# Patient Record
Sex: Female | Born: 1965 | Race: White | Hispanic: Yes | Smoking: Current every day smoker
Health system: Southern US, Community
[De-identification: ages and names within clinical notes are randomized; demographics above are authoritative.]

## PROBLEM LIST (undated history)

## (undated) DIAGNOSIS — I509 Heart failure, unspecified: Secondary | ICD-10-CM

## (undated) DIAGNOSIS — E079 Disorder of thyroid, unspecified: Secondary | ICD-10-CM

## (undated) DIAGNOSIS — E785 Hyperlipidemia, unspecified: Secondary | ICD-10-CM

## (undated) HISTORY — PX: ABDOMINAL HYSTERECTOMY: SHX81

---

## 2014-02-22 ENCOUNTER — Emergency Department (INDEPENDENT_AMBULATORY_CARE_PROVIDER_SITE_OTHER)
Admission: EM | Admit: 2014-02-22 | Discharge: 2014-02-22 | Disposition: A | Discharge status: Home / Self Care | Payer: PRIVATE HEALTH INSURANCE | Source: Home / Self Care | Attending: Family Medicine | Admitting: Family Medicine

## 2014-02-22 DIAGNOSIS — I5043 Acute on chronic combined systolic (congestive) and diastolic (congestive) heart failure: Secondary | ICD-10-CM

## 2014-02-22 NOTE — ED Notes (Signed)
Patient complains of dizziness, states he also has foot pain, states feels "tingly" and also like "ice picks" are going in her feet, symptoms started a week ago, c/o swelling in legs and ankles and feels as if she retaining fluid, states her PCP has advised that if she ever feels as if she's retaining fluid she should be seen

## 2014-02-22 NOTE — ED Provider Notes (Signed)
CSN: [id number]     Arrival date & time 02/22/14  1325 History   First MD Initiated Contact with Patient 02/22/14 1420     Chief Complaint  Patient presents with  . Dizziness      HPI Comments: Patient complains of increased fatigue, dizziness, and nasal congestion over the past several days.  During the past two weeks she has gained about 15 pounds, and has noticed persistent edema upon awakening during the past five days despite her Lasix.  She has had chills/sweats. Last February she was hospitalized with severe CHF, and symptoms suggestive of a viral cardiomyopathy.  She has been advised by her PCP to seek care if she begins to retain fluid.  The history is provided by the patient.    No past medical history on file. No past surgical history on file. No family history on file. History  Substance Use Topics  . Smoking status: Not on file  . Smokeless tobacco: Not on file  . Alcohol Use: Not on file   OB History    No data available     Review of Systems No sore throat + dizziness ? cough No pleuritic pain + wheezing + nasal congestion + post-nasal drainage + sinus pain/pressure No itchy/red eyes No earache No hemoptysis + SOB No fever, + chills/sweats No nausea No vomiting + abdominal pain No diarrhea No urinary symptoms No skin rash + fatigue + myalgias + headache Used OTC meds without relief  Allergies  Review of patient's allergies indicates no known allergies.  Home Medications   Prior to Admission medications   Medication Sig Start Date End Date Taking? Authorizing Provider  clonazePAM (KLONOPIN) 1 MG tablet Take 1 mg by mouth 2 (two) times daily.   Yes Historical Provider, MD  ESTROGENS CONJUGATED PO Take by mouth daily.   Yes Historical Provider, MD  ROPINIROLE HCL PO Take by mouth daily.   Yes Historical Provider, MD  sertraline (ZOLOFT) 100 MG tablet Take 100 mg by mouth 2 (two) times daily.   Yes Historical Provider, MD   BP 112/77 mmHg   Pulse 62  Temp(Src) 97.5 F (36.4 C) (Oral)  Wt 214 lb (97.07 kg)  SpO2 98% Physical Exam Nursing notes and Vital Signs reviewed. Appearance:  Patient appears stated age, and in no acute distress.  Patient is obese Eyes:  Pupils are equal, round, and reactive to light and accomodation.  Extraocular movement is intact.  Conjunctivae are not inflamed  Ears:  Canals normal.  Tympanic membranes normal.  Nose:  Mildly congested turbinates.  No sinus tenderness.   Pharynx:  Minimal erythema Neck:  Supple.  Tender enlarged posterior nodes are palpated bilaterally.  Neck vein distension is present.  Lungs:  Clear to auscultation.  Breath sounds are equal. Chest:  Distinct tenderness to palpation over the mid-sternum.   Heart:  Regular rate and rhythm without murmurs, rubs, or gallops.  Abdomen:  Nontender without masses or hepatosplenomegaly.  Bowel sounds are present.  No CVA or flank tenderness.  Extremities:  Bilateral lower leg pitting edema.  No calf tenderness Skin:  No rash present.   ED Course  Procedures  none      MDM   1. 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

## 2014-03-01 NOTE — Discharge Instructions (Signed)

## 2014-03-07 ENCOUNTER — Emergency Department (INDEPENDENT_AMBULATORY_CARE_PROVIDER_SITE_OTHER)
Admission: EM | Admit: 2014-03-07 | Discharge: 2014-03-07 | Disposition: A | Discharge status: Home / Self Care | Payer: PRIVATE HEALTH INSURANCE | Source: Home / Self Care | Attending: Family Medicine | Admitting: Family Medicine

## 2014-03-07 ENCOUNTER — Encounter: Payer: Self-pay | Admitting: *Deleted

## 2014-03-07 DIAGNOSIS — J069 Acute upper respiratory infection, unspecified: Secondary | ICD-10-CM

## 2014-03-07 DIAGNOSIS — H6503 Acute serous otitis media, bilateral: Secondary | ICD-10-CM

## 2014-03-07 DIAGNOSIS — B9789 Other viral agents as the cause of diseases classified elsewhere: Principal | ICD-10-CM

## 2014-03-07 HISTORY — DX: Hyperlipidemia, unspecified: E78.5

## 2014-03-07 HISTORY — DX: Disorder of thyroid, unspecified: E07.9

## 2014-03-07 HISTORY — DX: Heart failure, unspecified: I50.9

## 2014-03-07 MED ORDER — AZITHROMYCIN 250 MG PO TABS: ORAL_TABLET | ORAL | Status: DC

## 2014-03-07 MED ORDER — TRIAMCINOLONE ACETONIDE 40 MG/ML IJ SUSP
40.0000 mg | Freq: Once | INTRAMUSCULAR | Status: AC
  Administered 2014-03-07: 40 mg via INTRAMUSCULAR

## 2014-03-07 MED ORDER — BENZONATATE 200 MG PO CAPS: 200.0000 mg | ORAL_CAPSULE | Freq: Every day | ORAL | Status: DC

## 2014-03-07 NOTE — ED Notes (Signed)
Pt c/o cough, congestion, runny nose, and SOB x 1 week. Taken Mucinex DM, Robitussin and sinus/cold med OTC.

## 2014-03-07 NOTE — ED Provider Notes (Signed)
CSN: [id number]     Arrival date & time 03/07/14  1311 History   First MD Initiated Contact with Patient 03/07/14 1345     Chief Complaint  Patient presents with  . Cough  . Nasal Congestion      HPI Comments: About five days ago patient developed sore throat, fatigue, sinus congestion, and left ear fullnesss followed by right ear fullness.  Three days ago she developed a cough, wheezing, shortness of breath with activity, and chills/sweats.  The history is provided by the patient.    Past Medical History  Diagnosis Date  . CHF (congestive heart failure)   . Hyperlipidemia   . Thyroid disease    Past Surgical History  Procedure Laterality Date  . Abdominal hysterectomy     Family History  Problem Relation Age of Onset  . Cancer Mother    History  Substance Use Topics  . Smoking status: Former Games developer  . Smokeless tobacco: Never Used  . Alcohol Use: No   OB History    No data available     Review of Systems + sore throat + hoarseness + cough No pleuritic pain + wheezing + nasal congestion + post-nasal drainage No sinus pain/pressure No itchy/red eyes + earache No hemoptysis + SOB No fever, + chills/sweats No nausea No vomiting No abdominal pain No diarrhea No urinary symptoms No skin rash + fatigue No myalgias No headache Used OTC meds without relief  Allergies  Bee venom; Other; and Penicillins  Home Medications   Prior to Admission medications   Medication Sig Start Date End Date Taking? Authorizing Provider  ESTROGENS CONJUGATED PO Take by mouth daily.   Yes Historical Provider, MD  levothyroxine (SYNTHROID, LEVOTHROID) 50 MCG tablet Take 50 mcg by mouth daily before breakfast.   Yes Historical Provider, MD  ROPINIROLE HCL PO Take by mouth daily.   Yes Historical Provider, MD  sertraline (ZOLOFT) 100 MG tablet Take 100 mg by mouth 2 (two) times daily.   Yes Historical Provider, MD  azithromycin (ZITHROMAX Z-PAK) 250 MG tablet Take 2 tabs today;  then begin one tab once daily for 4 more days. 03/07/14   Lattie Haw, MD  benzonatate (TESSALON) 200 MG capsule Take 1 capsule (200 mg total) by mouth at bedtime. Take as needed for cough 03/07/14   Lattie Haw, MD  clonazePAM (KLONOPIN) 1 MG tablet Take 1 mg by mouth 2 (two) times daily.    Historical Provider, MD   BP 108/76 mmHg  Pulse 84  Temp(Src) 97.6 F (36.4 C) (Oral)  Resp 22  Wt 205 lb (92.987 kg)  SpO2 99% Physical Exam Nursing notes and Vital Signs reviewed. Appearance:  Patient appears stated age, and in no acute distress.  Patient is obese Eyes:  Pupils are equal, round, and reactive to light and accomodation.  Extraocular movement is intact.  Conjunctivae are not inflamed  Ears:  Canals normal.  Left tympanic membrane has a blush of erythema with air/fluid level present.  Right tympanic membrane has serous effusion.  Nose:  Congested turbinates.  No sinus tenderness.   Pharynx:  Normal Neck:  Supple.  Tender enlarged posterior nodes are palpated bilaterally  Lungs:  Clear to auscultation.  Breath sounds are equal.  Heart:  Regular rate and rhythm without murmurs, rubs, or gallops.  Abdomen:  Nontender without masses or hepatosplenomegaly.  Bowel sounds are present.  No CVA or flank tenderness.  Extremities:  No edema.  No calf tenderness Skin:  No  rash present.   ED Course  Procedures  none   MDM   1. Viral URI with cough   2. Bilateral acute serous otitis media, recurrence not specified    Kenalog  IM.  Rx for Z-pack to cover atypicals.  Prescription written for Benzonatate [facility]) to take at bedtime for night-time cough.  Take plain Mucinex (1200 mg guaifenesin) twice daily for cough and congestion. Increase fluid intake, rest. May use Afrin nasal spray (or generic oxymetazoline) once daily for about 5 days.  Also recommend using saline nasal spray several times daily and saline nasal irrigation (AYR is a common brand) Try warm salt water gargles for  sore throat.  Continue albuterol inhaler as needed Stop all antihistamines for now, and other non-prescription cough/cold preparations. If symptoms become significantly worse during the night or over the weekend, proceed to the local emergency room.  Followup with Family Doctor in one week.     Lattie Haw, MD 03/09/14 [phone]

## 2014-03-07 NOTE — Discharge Instructions (Signed)
Take plain Mucinex (1200 mg guaifenesin) twice daily for cough and congestion. Increase fluid intake, rest. May use Afrin nasal spray (or generic oxymetazoline) once daily for about 5 days.  Also recommend using saline nasal spray several times daily and saline nasal irrigation (AYR is a common brand) Try warm salt water gargles for sore throat.  Continue albuterol inhaler as needed Stop all antihistamines for now, and other non-prescription cough/cold preparations. If symptoms become significantly worse during the night or over the weekend, proceed to the local emergency room.

## 2014-03-10 ENCOUNTER — Ambulatory Visit: Payer: Self-pay | Admitting: Family Medicine

## 2014-06-03 ENCOUNTER — Telehealth: Payer: Self-pay | Admitting: *Deleted

## 2014-06-19 NOTE — Telephone Encounter (Signed)
error 

## 2014-06-24 ENCOUNTER — Ambulatory Visit: Payer: PRIVATE HEALTH INSURANCE | Admitting: Family Medicine

## 2014-07-11 ENCOUNTER — Ambulatory Visit (INDEPENDENT_AMBULATORY_CARE_PROVIDER_SITE_OTHER): Payer: PRIVATE HEALTH INSURANCE | Admitting: Physical Therapy

## 2014-07-11 DIAGNOSIS — M6281 Muscle weakness (generalized): Secondary | ICD-10-CM

## 2014-07-11 DIAGNOSIS — M7989 Other specified soft tissue disorders: Secondary | ICD-10-CM

## 2014-07-11 DIAGNOSIS — M25562 Pain in left knee: Secondary | ICD-10-CM

## 2014-07-11 DIAGNOSIS — M25662 Stiffness of left knee, not elsewhere classified: Secondary | ICD-10-CM

## 2014-07-11 DIAGNOSIS — R262 Difficulty in walking, not elsewhere classified: Secondary | ICD-10-CM

## 2014-07-11 NOTE — Patient Instructions (Signed)
Chair Knee Flexion   Keeping feet on floor, slide foot of affected leg back, bending knee. You may use other foot to push leg backward. Hold _20-30___ seconds. Repeat _3___ times. Do __3__ sessions a day.  Also actively bend and straighten the knee 10 times. 1-3 sets of 10. 3 x/day.  http://gt2.exer.us/305   Copyright  VHI. All rights reserved.    Quad Set   With other leg bent, foot flat, slowly tighten muscles on thigh of straight leg while counting out loud to _5___.  Repeat _10-20___ times. Do ___3_ sessions per day.  http://gt2.exer.us/276    WEAR KNEE IMMOBILIZER FOR NEXT THREE EXERCISES:  Hip Flexion / Knee Extension: Straight-Leg Raise (Eccentric)   Lie on back. Lift leg with knee straight. Slowly lower leg for 3-5 seconds. __10_ reps per set, 1-3___ sets. 2x per day. Lower like elevator, stopping at each floor.  ABDUCTION: Side-Lying (Active)   Lie on right side, top leg straight. Raise top leg as far as possible.  Complete _1-3__ sets of 10___ repetitions. Perform _2__ sessions per day.  http://gtsc.exer.us/94    Copyright  VHI. All rights reserved.     EXTENSION: Standing (Active)  Stand, both feet flat. Draw right leg behind body as far as possible. Use 0___ lbs. Complete 10 repetitions. 1-3 sets. Perform __2_ sessions per day.  Copyright  VHI. All rights reserved.   Solon PalmJulie Niall Illes, PT 07/11/2014 11:59 AM  [facility] [address] [place], [place] [address]  [phone] (office) [phone] (fax)

## 2014-07-11 NOTE — Therapy (Signed)
[facility] [address] [place], [place], [address] Phone: [phone]   Fax:  [phone]  Physical Therapy Evaluation  Patient Details  Name: Lindsey Love MRN: [id number] Date of Birth: September 30, 1965 Referring Provider:  Francena HanlySupple, Kevin, MD  Encounter Date: 07/11/2014      PT End of Session - 07/11/14 1201    Visit Number 1   Number of Visits 8   Date for PT Re-Evaluation 08/08/14   PT Start Time 1105   PT Stop Time 1203   PT Time Calculation (min) 58 min   Activity Tolerance Patient limited by pain   Behavior During Therapy [facility] for tasks assessed/performed      Past Medical History  Diagnosis Date  . CHF (congestive heart failure)   . Hyperlipidemia   . Thyroid disease     Past Surgical History  Procedure Laterality Date  . Abdominal hysterectomy      There were no vitals filed for this visit.  Visit Diagnosis:  Pain in left knee - Plan: PT plan of care cert/re-cert  Stiffness of knee joint, left - Plan: PT plan of care cert/re-cert  Swelling of limb - Plan: PT plan of care cert/re-cert  Generalized muscle weakness - Plan: PT plan of care cert/re-cert  Difficulty walking - Plan: PT plan of care cert/re-cert      Subjective Assessment - 07/11/14 1110    Subjective Patient reports she fell this morning coming up a curb while letting her dog out. she fell on her left side and now c/o left sided arm, hip and leg pain as well as increased swelling in foot. Patient Robynn Paneorigianlly tore her ACL 06/25/14 when she fell off a ladder while putting up wallpaper.  She has been in a knee immobilizer for two weeks. and amb with one crutch.   Limitations Sitting   How long can you sit comfortably? 5 min   How long can you stand comfortably? 30 minutes   Diagnostic tests MRI   Currently in Pain? Yes   Pain Score 8    Pain Location Knee   Pain Orientation Left   Pain Descriptors / Indicators Burning;Sharp   Pain Type  Acute pain   Pain Onset 1 to 4 weeks ago   Pain Frequency Constant   Aggravating Factors  prolonged sitting standing walking   Pain Relieving Factors meds (aleve)   Effect of Pain on Daily Activities patient cannot wok as hairdresser, difficulty climbing stairs (40) at apartment complex            [facility] PT Assessment - 07/11/14 0001    Assessment   Medical Diagnosis Lt knee ACL tear   Onset Date 06/25/14   Next MD Visit 08/06/14   Prior Therapy none   Precautions   Precautions Knee   Required Braces or Orthoses Knee Immobilizer - Left   Restrictions   Weight Bearing Restrictions Yes   LLE Weight Bearing Weight bearing as tolerated   Balance Screen   Has the patient fallen in the past 6 months Yes   How many times? 2   Has the patient had a decrease in activity level because of a fear of falling?  Yes   Is the patient reluctant to leave their home because of a fear of falling?  Yes   Prior Function   Level of Independence Independent with basic ADLs   Vocation Full time employment   Vocation Requirements standing 1hour   Observation/Other Assessments   Focus  on Therapeutic Outcomes (FOTO)  82% limited (goal 50%)   ROM / Strength   AROM / PROM / Strength AROM;Strength   AROM   Overall AROM Comments bil hips WNL   AROM Assessment Site Knee   Right/Left Knee Left   Left Knee Extension 0   Left Knee Flexion 10  Passive 90   Strength   Overall Strength Comments Left knee ext 3+/5, flex 4-/5  Rt hip flex; knee 5/5   Strength Assessment Site Hip   Right/Left Hip Left   Left Hip Flexion 4/5   Left Hip Extension --  unable to test   Left Hip ABduction 5/5   Flexibility   Soft Tissue Assessment /Muscle Length yes   Hamstrings L WNL   Palpation   Palpation Marked TTP of med and lat left knee, mod tenderness of left patellar tendon  edema ankle L 29 cm R cm 25.5 cm   Ambulation/Gait   Ambulation/Gait Yes   Ambulation/Gait Assistance 6: Modified independent  (Device/Increase time)   Ambulation Distance (Feet) 30 Feet   Assistive device Crutches;Other (Comment)  single crutch    Gait Pattern Decreased step length - left;Decreased stance time - left;Left circumduction   Ambulation Surface Level   Gait Comments Patient using crutch on affected side  educated to use on R side.                   [facility] Adult PT Treatment/Exercise - 07/11/14 0001    Exercises   Exercises Knee/Hip   Knee/Hip Exercises: Stretches   Quad Stretch 3 reps;20 seconds  seated using RLE to hold stretch   Knee/Hip Exercises: Standing   Other Standing Knee Exercises hip ext at counter with immobilizer on. PT demo'd.   Knee/Hip Exercises: Seated   Heel Slides AROM;Left;5 reps   Heel Slides Limitations requires use of RLE to reach end range   Knee/Hip Exercises: Supine   Quad Sets 5 reps  5 sec hold   Quad Sets Limitations poor quad contraction. advised patient to sit up palpate and look at quad as well as perform bil  for feedback   Straight Leg Raises Strengthening  2 reps with PT assist; Pt to do in immobilizer   Knee/Hip Exercises: Sidelying   Hip ABduction --  to do in immobilizer; performed with PT support in eval   Modalities   Modalities Cryotherapy;Electrical Stimulation   Cryotherapy   Number Minutes Cryotherapy 15 Minutes   Cryotherapy Location Knee   Type of Cryotherapy --  vaso 3* med pressure   Electrical Stimulation   Electrical Stimulation Location ankle/knee   Electrical Stimulation Action ion repelling   Electrical Stimulation Parameters to tolerance   Electrical Stimulation Goals Edema                PT Education - 07/11/14 1200    Education provided Yes   Education Details HEP; educated on use of single crutch in RUE.   Person(s) Educated Patient   Methods Explanation;Demonstration;Handout   Comprehension Verbalized understanding;Returned demonstration          PT Short Term Goals - 07/11/14 1232    PT SHORT  TERM GOAL #1   Title I with initial HEP   Time 2   Period Weeks   Status New   PT SHORT TERM GOAL #2   Title able to perform a solid quad contraction and SLR on L with no lag   Time 2   Period Weeks   Status  New   PT SHORT TERM GOAL #3   Title Improved Left knee flexion to 100 degrees actively   Time 2   Period Weeks   Status New           PT Long Term Goals - 07/11/14 1233    PT LONG TERM GOAL #1   Title I with advanced HEP   Time 4   Period Weeks   Status New   PT LONG TERM GOAL #2   Title L knee flexion within 10 degrees of Rt   Time 4   Period Weeks   Status New   PT LONG TERM GOAL #3   Title demo 5/5 LLE strength   Time 4   Period Weeks   PT LONG TERM GOAL #4   Title climb stairs with a reciprocal gait pattern on stairs without immobilizer   Time 4   Period Weeks   Status New   PT LONG TERM GOAL #5   Title amb with a normal gait pattern without AD    Time 4   Status New   PT LONG TERM GOAL #6   Title decreased pain by 50% with sitting and standing   Time 4   Period Weeks   Status New   PT LONG TERM GOAL #7   Title improved FOTO to 50%   Time 4   Period Weeks   Status New               Plan - 07/11/14 1203    Clinical Impression Statement Patient is a pleasant 49 year old female who fell off a ladder 06/25/14 and tore her left ACL. She has been in an immobllizer for two weeks and presents to therapy to restore strength and ROM before surgery. She fell on her left side this morning going up a curb and has increased pain and swelling in the LLE. No bruising is present. Patient ambulates WBAT with a single crutch, however she reports that she brings the crutch mainly for safety.  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 She is presently unable to do this. She demos poor quad contraction and has decreased ROM affecting ADLS.  She lives in an apartment with 40 stairs and must use a step to gait pattern with immobilizer.    Pt will benefit from skilled therapeutic intervention in order to improve on the following deficits Abnormal gait;Decreased range of motion;Difficulty walking;Increased edema;Decreased activity tolerance;Pain;Decreased strength;Decreased mobility   Rehab Potential Good   PT Frequency 2x / week   PT Duration 4 weeks   PT Treatment/Interventions ADLs/Self Care Home Management;Moist Heat;Patient/family education;DME Instruction;Therapeutic exercise;Passive range of motion;Ultrasound;Gait training;Manual techniques;Cryotherapy;Stair training;Neuromuscular re-education;Electrical Stimulation   PT Next Visit Plan ROM, quad/hip strength, modalities for pain/edema   Consulted and Agree with Plan of Care Patient         Problem List There are no active problems to display for this patient.   Solon Palm PT  07/11/2014, 12:49 PM  [facility] [address] [place], [place], [address] Phone: [phone]   Fax:  [phone]

## 2014-07-14 ENCOUNTER — Encounter: Payer: PRIVATE HEALTH INSURANCE | Admitting: Physical Therapy

## 2014-07-16 ENCOUNTER — Ambulatory Visit (INDEPENDENT_AMBULATORY_CARE_PROVIDER_SITE_OTHER): Payer: PRIVATE HEALTH INSURANCE | Admitting: Physical Therapy

## 2014-07-16 DIAGNOSIS — M25662 Stiffness of left knee, not elsewhere classified: Secondary | ICD-10-CM

## 2014-07-16 DIAGNOSIS — M7989 Other specified soft tissue disorders: Secondary | ICD-10-CM | POA: Diagnosis not present

## 2014-07-16 DIAGNOSIS — M6281 Muscle weakness (generalized): Secondary | ICD-10-CM

## 2014-07-16 DIAGNOSIS — M25562 Pain in left knee: Secondary | ICD-10-CM

## 2014-07-16 DIAGNOSIS — R262 Difficulty in walking, not elsewhere classified: Secondary | ICD-10-CM

## 2014-07-16 NOTE — Therapy (Signed)
[place], [place], [address] Phone: [phone]   Fax:  [phone]  Physical Therapy Treatment  Patient Details  Name: Lindsey Love MRN: [id number] Date of Birth: 1965-09-28 Referring Provider:  Justice Britain, MD  Encounter Date: 07/16/2014      PT End of Session - 07/16/14 1111    Visit Number 2   Number of Visits 8   Date for PT Re-Evaluation 08/08/14   PT Start Time 1107   PT Stop Time 1205   PT Time Calculation (min) 58 min   Activity Tolerance Patient limited by pain      Past Medical History  Diagnosis Date  . CHF (congestive heart failure)   . Hyperlipidemia   . Thyroid disease     Past Surgical History  Procedure Laterality Date  . Abdominal hysterectomy      There were no vitals filed for this visit.  Visit Diagnosis:  Pain in left knee  Stiffness of knee joint, left  Swelling of limb  Generalized muscle weakness  Difficulty walking      Subjective Assessment - 07/16/14 1111    Subjective Pt presents with Lt knee immobilizer (no crutches).  Pt reports she is compliant with HEP (2-3x/day). Pt voiced being anxious to have surgery to move things along.    Currently in Pain? Yes   Pain Score 9    Pain Location Knee   Pain Orientation Left   Pain Descriptors / Indicators Burning;Sharp   Aggravating Factors  prolonged positions    Pain Relieving Factors ice, medicine             [facility] PT Assessment - 07/16/14 0001    Assessment   Medical Diagnosis Lt knee ACL tear   Onset Date 06/25/14   Next MD Visit 08/06/14   AROM   AROM Assessment Site Knee;Ankle   Right/Left Knee Left   Left Knee Extension 0   Left Knee Flexion 85  heel slide, AAROM to 105   Right/Left Ankle Left   Left Ankle Dorsiflexion 56   Left Ankle Plantar Flexion 2                     [facility] Adult PT Treatment/Exercise - 07/16/14 0001    Knee/Hip Exercises: Seated   Long Arc  Quad AROM;Left;10 reps   Knee/Hip Exercises: Supine   Quad Sets 5 reps   Heel Slides AROM;AAROM;5 reps  3 reps AROM to 85deg, 2 reps with strap to 105 deg   Heel Slides Limitations very slow speed due to pain    Straight Leg Raises Left;1 set  8 reps    Other Supine Knee Exercises Hip ABD <> add with foot on towel x 10 reps each direction    Knee/Hip Exercises: Sidelying   Clams Lt x 10 reps    Knee/Hip Exercises: Prone   Other Prone Exercises TKE 5 sec hold x 8 reps (initially difficult to DF)    Modalities   Modalities Cryotherapy;Electrical Stimulation   Cryotherapy   Number Minutes Cryotherapy 15 Minutes   Cryotherapy Location Knee;Ankle   Type of Cryotherapy --  vaso, med, 3* to knee, icepack to ankle   Acupuncturist Stimulation Location Lt knee    Electrical Stimulation Action ion repelling    Electrical Stimulation Parameters to tolerance x 15 min    Electrical Stimulation Goals Edema;Pain   Manual Therapy   Manual Therapy Myofascial release  Myofascial Release to Lt glute/piriformis (decreased tolerance )                   PT Short Term Goals - 07/16/14 1359    PT SHORT TERM GOAL #1   Title I with initial HEP   Time 2   Period Weeks   Status Achieved   PT SHORT TERM GOAL #2   Title able to perform a solid quad contraction and SLR on L with no lag   Time 2   Period Weeks   Status Achieved   PT SHORT TERM GOAL #3   Title Improved Left knee flexion to 100 degrees actively   Time 2   Period Weeks   Status On-going           PT Long Term Goals - 07/11/14 1233    PT LONG TERM GOAL #1   Title I with advanced HEP   Time 4   Period Weeks   Status New   PT LONG TERM GOAL #2   Title L knee flexion within 10 degrees of Rt   Time 4   Period Weeks   Status New   PT LONG TERM GOAL #3   Title demo 5/5 LLE strength   Time 4   Period Weeks   PT LONG TERM GOAL #4   Title climb stairs with a reciprocal gait pattern on stairs  without immobilizer   Time 4   Period Weeks   Status New   PT LONG TERM GOAL #5   Title amb with a normal gait pattern without AD    Time 4   Status New   PT LONG TERM GOAL #6   Title decreased pain by 50% with sitting and standing   Time 4   Period Weeks   Status New   PT LONG TERM GOAL #7   Title improved FOTO to 50%   Time 4   Period Weeks   Status New               Plan - 07/16/14 1357    Clinical Impression Statement Pt demo improved Lt knee ROM and quad strength this visit compared to last session.  Pain continues to be a barrier.  Pt has met STG #1 and 2.    Pt will benefit from skilled therapeutic intervention in order to improve on the following deficits Abnormal gait;Decreased range of motion;Difficulty walking;Increased edema;Decreased activity tolerance;Pain;Decreased strength;Decreased mobility   Rehab Potential Good   PT Frequency 2x / week   PT Duration 4 weeks   PT Treatment/Interventions ADLs/Self Care Home Management;Moist Heat;Patient/family education;DME Instruction;Therapeutic exercise;Passive range of motion;Ultrasound;Gait training;Manual techniques;Cryotherapy;Stair training;Neuromuscular re-education;Electrical Stimulation   PT Next Visit Plan ROM, quad/hip strength, modalities for pain/edema   Consulted and Agree with Plan of Care Patient        Problem List There are no active problems to display for this patient.  Kerin Perna, PTA 07/16/2014 2:01 PM  [place], [place], [address] Phone: [phone]   Fax:  [phone]

## 2014-07-16 NOTE — Patient Instructions (Signed)
SEATED Gastroc / Heel Cord Stretch - Seated With Towel   Sit on floor, towel around ball of foot. Gently pull foot in toward body, stretching heel cord and calf. Hold for _30__ seconds. Repeat on involved leg. Repeat __3_ times. Do _3__ times per day.   [facility] [address] [place], [place] [address]  [phone] (office) [phone] (fax)

## 2014-07-18 ENCOUNTER — Ambulatory Visit (INDEPENDENT_AMBULATORY_CARE_PROVIDER_SITE_OTHER): Payer: PRIVATE HEALTH INSURANCE | Admitting: Physical Therapy

## 2014-07-18 DIAGNOSIS — M7989 Other specified soft tissue disorders: Secondary | ICD-10-CM | POA: Diagnosis not present

## 2014-07-18 DIAGNOSIS — M25662 Stiffness of left knee, not elsewhere classified: Secondary | ICD-10-CM

## 2014-07-18 DIAGNOSIS — M6281 Muscle weakness (generalized): Secondary | ICD-10-CM | POA: Diagnosis not present

## 2014-07-18 DIAGNOSIS — M25562 Pain in left knee: Secondary | ICD-10-CM

## 2014-07-18 DIAGNOSIS — R262 Difficulty in walking, not elsewhere classified: Secondary | ICD-10-CM

## 2014-07-18 NOTE — Therapy (Addendum)
[place], [place], [address] Phone: [phone]   Fax:  [phone]  Physical Therapy Treatment  Patient Details  Name: Lindsey Love MRN: [id number] Date of Birth: 12-08-1965 Referring Provider:  Justice Britain, MD  Encounter Date: 07/18/2014      PT End of Session - 07/18/14 1115    Visit Number 3   Number of Visits 8   Date for PT Re-Evaluation 08/08/14   PT Start Time 1111   PT Stop Time 1204   PT Time Calculation (min) 53 min   Activity Tolerance Patient limited by pain      Past Medical History  Diagnosis Date  . CHF (congestive heart failure)   . Hyperlipidemia   . Thyroid disease     Past Surgical History  Procedure Laterality Date  . Abdominal hysterectomy      There were no vitals filed for this visit.  Visit Diagnosis:  Pain in left knee  Stiffness of knee joint, left  Swelling of limb  Generalized muscle weakness  Difficulty walking      Subjective Assessment - 07/18/14 1116    Subjective Pt reports she has been working on her Lt knee ROM and her HEP, walking short household distances without immobilizer.    Currently in Pain? Yes   Pain Score 7    Pain Location Knee   Pain Orientation Left   Pain Descriptors / Indicators Burning;Sharp   Aggravating Factors  extension without the brace, prolonged positions    Pain Relieving Factors ice, medicine    Multiple Pain Sites Yes   Pain Score 5   Pain Location Knee   Pain Orientation Right   Pain Descriptors / Indicators Aching            [facility] PT Assessment - 07/18/14 0001    Assessment   Medical Diagnosis Lt knee ACL tear   Onset Date 06/25/14   AROM   AROM Assessment Site Knee   Right/Left Knee Left   Left Knee Extension 0   Left Knee Flexion 115   Strength   Strength Assessment Site Hip   Right/Left Hip Left   Left Hip Flexion 4+/5   Left Hip ABduction 5/5                      [facility] Adult PT Treatment/Exercise - 07/18/14 0001    Exercises   Exercises Knee/Hip   Knee/Hip Exercises: Stretches   Gastroc Stretch 2 reps;30 seconds   Knee/Hip Exercises: Aerobic   Stationary Bike NuStep for ROM to LLE (use of UE) x 5 min.    Knee/Hip Exercises: Standing   Heel Raises 10 reps;1 set  with toe raises and heavy UE support    Terminal Knee Extension Left;1 set;10 reps;Theraband   Theraband Level (Terminal Knee Extension) Level 2 (Red)   SLS 3 trials with LLE up to  5 sec.    Other Standing Knee Exercises Weight shifts R to L (without immobilizer)   Knee/Hip Exercises: Seated   Other Seated Knee Exercises sit to stand x 5 reps (heavy use of UE and slow speed)    Knee/Hip Exercises: Supine   Straight Leg Raises Left;10 reps   Modalities   Modalities Cryotherapy;Vasopneumatic;Electrical Stimulation   Cryotherapy   Number Minutes Cryotherapy 15 Minutes   Cryotherapy Location Knee  Rt knee   Type of Cryotherapy Ice pack   Electrical Stimulation   Electrical Stimulation Location Lt knee  Astronomer Parameters to tolerance x 15 min    Electrical Stimulation Goals Edema;Pain   Vasopneumatic   Number Minutes Vasopneumatic  15 minutes   Vasopnuematic Location  Knee  Lt   Vasopneumatic Pressure Medium   Vasopneumatic Temperature  3*                  PT Short Term Goals - 07/18/14 1202    PT SHORT TERM GOAL #1   Title I with initial HEP   Time 2   Period Weeks   Status Achieved   PT SHORT TERM GOAL #2   Title able to perform a solid quad contraction and SLR on L with no lag   Time 2   Period Weeks   Status Achieved   PT SHORT TERM GOAL #3   Title Improved Left knee flexion to 100 degrees actively   Time 2   Period Weeks   Status Achieved           PT Long Term Goals - 07/11/14 1233    PT LONG TERM GOAL #1   Title I with advanced HEP   Time 4   Period Weeks   Status New   PT  LONG TERM GOAL #2   Title L knee flexion within 10 degrees of Rt   Time 4   Period Weeks   Status New   PT LONG TERM GOAL #3   Title demo 5/5 LLE strength   Time 4   Period Weeks   PT LONG TERM GOAL #4   Title climb stairs with a reciprocal gait pattern on stairs without immobilizer   Time 4   Period Weeks   Status New   PT LONG TERM GOAL #5   Title amb with a normal gait pattern without AD    Time 4   Status New   PT LONG TERM GOAL #6   Title decreased pain by 50% with sitting and standing   Time 4   Period Weeks   Status New   PT LONG TERM GOAL #7   Title improved FOTO to 50%   Time 4   Period Weeks   Status New               Plan - 07/18/14 1200    Clinical Impression Statement Pt demo improved strength and Lt knee ROM this visit.  Pt has met STG #3 and is making good progress towards other goals.    Pt will benefit from skilled therapeutic intervention in order to improve on the following deficits Abnormal gait;Decreased range of motion;Difficulty walking;Increased edema;Decreased activity tolerance;Pain;Decreased strength;Decreased mobility   Rehab Potential Good   PT Frequency 2x / week   PT Duration 4 weeks   PT Treatment/Interventions ADLs/Self Care Home Management;Moist Heat;Patient/family education;DME Instruction;Therapeutic exercise;Passive range of motion;Ultrasound;Gait training;Manual techniques;Cryotherapy;Stair training;Neuromuscular re-education;Electrical Stimulation   PT Next Visit Plan ROM, quad/hip strength, modalities for pain/edema   Consulted and Agree with Plan of Care Patient        Problem List There are no active problems to display for this patient.   Kerin Perna, PTA 07/18/2014 12:21 PM  [place], [place], [address] Phone: [phone]   Fax:  [phone]     PHYSICAL THERAPY DISCHARGE SUMMARY  Visits from Start of Care:  3  Current functional level related to goals / functional outcomes: Pt to have surgery  Remaining deficits: See above   Education / Equipment: HEP Plan: Patient agrees to discharge.  Patient goals were not met. Patient is being discharged due to a change in medical status. Patient to have surgery ?????   Jeral Pinch, PT 07/31/2014 10:24 AM

## 2014-07-21 ENCOUNTER — Encounter: Payer: PRIVATE HEALTH INSURANCE | Admitting: Physical Therapy

## 2014-07-23 ENCOUNTER — Encounter: Payer: PRIVATE HEALTH INSURANCE | Admitting: Physical Therapy

## 2014-07-28 ENCOUNTER — Encounter: Payer: PRIVATE HEALTH INSURANCE | Admitting: Physical Therapy

## 2014-07-30 ENCOUNTER — Encounter: Payer: PRIVATE HEALTH INSURANCE | Admitting: Physical Therapy

## 2014-08-06 ENCOUNTER — Ambulatory Visit: Payer: PRIVATE HEALTH INSURANCE | Admitting: Physical Therapy

## 2014-08-07 ENCOUNTER — Ambulatory Visit (INDEPENDENT_AMBULATORY_CARE_PROVIDER_SITE_OTHER): Payer: PRIVATE HEALTH INSURANCE | Admitting: Rehabilitative and Restorative Service Providers"

## 2014-08-07 DIAGNOSIS — M6281 Muscle weakness (generalized): Secondary | ICD-10-CM | POA: Diagnosis not present

## 2014-08-07 DIAGNOSIS — M7989 Other specified soft tissue disorders: Secondary | ICD-10-CM

## 2014-08-07 DIAGNOSIS — M25662 Stiffness of left knee, not elsewhere classified: Secondary | ICD-10-CM | POA: Diagnosis not present

## 2014-08-07 DIAGNOSIS — R262 Difficulty in walking, not elsewhere classified: Secondary | ICD-10-CM

## 2014-08-07 DIAGNOSIS — M25562 Pain in left knee: Secondary | ICD-10-CM

## 2014-08-07 NOTE — Patient Instructions (Signed)
Quad Set    [place] [phone]   Slowly tighten muscles on thigh of straight leg while counting out loud to _10___. Repeat with other leg. Repeat __10__ times. Do _2-3___ sessions per day.  Strengthening: Straight Leg Raise (Phase 1)   Tighten muscles on front of right thigh, then lift leg _10-12___ inches from surface, keeping knee locked.  Repeat __10__ times per set. Do _1-3___ sets per session. Do _2-3___ sessions per day.  Hip Extension (Prone)   Lift left leg _8-10___ inches from floor, keeping knee locked. Repeat _10___ times per set. Do _1-3__ sets per session. Do __2-3__ sessions per day.  Hip Adduction: Leg Lift (Eccentric) - Side-Lying   Lie on side with top leg bent, foot flat behind lower leg.  lift lower leg. Hold for 3-5 seconds. _10__ reps per set, 1-3___ sets per day, 2-3___ days per week.   Abduction: Side Leg Lift (Eccentric) - Side-Lying   Lie on side. Lift top leg slightly higher than shoulder level. Keep top leg straight with body, toes pointing forward. Slowly lower for 3-5 seconds. _10__ reps per set, 1-3___ sets per day, _2-3__ days per week.      Quad Strength: Retro Stool Walk   Using both legs, "walk" backward down a long hall. Repeat _10___ times or for ____ minutes. Do 2-3____ sessions per day.   .On back, prop ankle on towel roll, allowing knee to straighten out without calf touching the surface. Allow knee to stretch for 1 min relax and repeat 2-3 times 2-3 times per day.

## 2014-08-07 NOTE — Therapy (Signed)
[facility] [place] [address] [place], [place], [address] Phone: [phone]   Fax:  [phone]  Physical Therapy Evaluation  Patient Details  Name: Crissie Aloi MRN: [id number] Date of Birth: Aug 02, 1965 Referring Provider:  Francena Hanly, MD  Encounter Date: 08/07/2014      PT End of Session - 08/07/14 1703    Visit Number 1   Number of Visits 24   Date for PT Re-Evaluation 10/30/14   PT Start Time 1600   Activity Tolerance Patient tolerated treatment well      Past Medical History  Diagnosis Date  . CHF (congestive heart failure)   . Hyperlipidemia   . Thyroid disease     Past Surgical History  Procedure Laterality Date  . Abdominal hysterectomy      There were no vitals filed for this visit.  Visit Diagnosis:  Pain in left knee - Plan: PT plan of care cert/re-cert  Stiffness of knee joint, left - Plan: PT plan of care cert/re-cert  Swelling of limb - Plan: PT plan of care cert/re-cert  Generalized muscle weakness - Plan: PT plan of care cert/re-cert  Difficulty walking - Plan: PT plan of care cert/re-cert      Subjective Assessment - 08/07/14 1530    Subjective Injury to Lt knee when she fell from a ladder sustaining ACL tear 06/25/14, seen for PT to increase ROM and strength Lt LE prior to surgery; underwent ACL repair 07/29/14. Doing OK since surgery.   Diagnostic tests MRI showed ACL tear    Currently in Pain? Yes   Pain Score 6    Pain Location Knee   Pain Orientation Left   Pain Descriptors / Indicators Burning;Sore   Pain Type Acute pain   Pain Onset More than a month ago   Pain Frequency Constant   Aggravating Factors  Laying with knee unsupported, tired with activity which also causes swelling.   Pain Relieving Factors Ice, medicine   Effect of Pain on Daily Activities patient unable to work due to injury and surgery, difficulty standing and walking, Has 40 stairs to enter/leave her  apartment.           [facility] PT Assessment - 08/07/14 0001    Assessment   Medical Diagnosis Lt knee ACL tear   Onset Date/Surgical Date 07/29/14   Next MD Visit 08/20/14   Precautions   Precautions Knee   Required Braces or Orthoses --  knee immobilizer - left   Restrictions   LLE Weight Bearing Weight bearing as tolerated   Balance Screen   Has the patient fallen in the past 6 months Yes   How many times? 2   Has the patient had a decrease in activity level because of a fear of falling?  Yes   Is the patient reluctant to leave their home because of a fear of falling?  Yes   Home Environment   Living Environment Private residence   Home Access Stairs to enter  40   Entrance Stairs-Number of Steps 40   Entrance Stairs-Rails Can reach both   Home Layout One level   Home Equipment Walker - 2 wheels;Walker - 4 wheels;Crutches;Bedside commode;Shower seat;Grab bars - toilet;Grab bars - tub/shower;Hand held shower head   Prior Function   Level of Independence Independent   Vocation Full time employment  hair dresser   Vocation Requirements standing 6 hr/day average   Observation/Other Assessments   Focus on Therapeutic Outcomes (FOTO)  82% limitation  ROM / Strength   AROM / PROM / Strength AROM   AROM   AROM Assessment Site Knee   Right/Left Knee Left   Left Knee Extension --  (-)4   Left Ankle Dorsiflexion 0   Left Ankle Plantar Flexion 53   Strength   Strength Assessment Site Hip   Right/Left Hip Left   Left Hip Flexion 4-/5   Left Hip ABduction 4/5   Left Hip ADduction 4+/5          [facility] Adult PT Treatment/Exercise - 08/07/14 0001    Ambulation/Gait   Ambulation/Gait Yes   Ambulation/Gait Assistance 6: Modified independent (Device/Increase time)   Ambulation Distance (Feet) 30 Feet   Assistive device Crutches   Gait Pattern Decreased step length - left;Decreased stance time - left;Left circumduction   Gait velocity slowed   Knee/Hip Exercises: Seated    Other Seated Knee Exercises sitting knee flexion 1 min x2 reps   Knee/Hip Exercises: Supine   Quad Sets Strengthening;Left;2 sets;5 reps   Quad Sets Limitations lateral quad much more active than middle and medial quad   Heel Slides AAROM;Left;5 sets;5 reps  patient assisted with sheet around foot   Straight Leg Raises Strengthening;Left;2 sets;5 sets  assisted by PT   Other Supine Knee Exercises Hip a   Knee/Hip Exercises: Sidelying   Hip ABduction Strengthening;Left;2 sets;5 reps   Hip ADduction Strengthening;2 sets;5 reps   Knee/Hip Exercises: Prone   Hip Extension Strengthening;2 sets;Left;5 reps   Modalities   Modalities Cryotherapy   Cryotherapy   Number Minutes Cryotherapy 15 Minutes   Cryotherapy Location Knee   Type of Cryotherapy Other (comment)   Electrical Stimulation   Electrical Stimulation Location Lt knee    Electrical Stimulation Action IFC   Electrical Stimulation Parameters to tolerance   Electrical Stimulation Goals Edema;Pain   Vasopneumatic   Number Minutes Vasopneumatic  15 minutes   Vasopnuematic Location  Knee   Vasopneumatic Pressure Low   Vasopneumatic Temperature  3*                PT Education - 08/07/14 1635    Education provided Yes   Education Details HEP; importance of consistent exercises; nature of rehab; self massage to quad   Person(s) Educated Patient   Methods Explanation;Demonstration;Tactile cues;Verbal cues;Handout   Comprehension Verbalized understanding;Returned demonstration;Verbal cues required;Tactile cues required          PT Short Term Goals - 08/07/14 1525    PT SHORT TERM GOAL #1   Title I with initial HEP   Time 3   Period Weeks   Status New   PT SHORT TERM GOAL #2   Title able to perform a solid quad contraction and SLR on L with no lag   Time 3   Period Weeks   Status New   PT SHORT TERM GOAL #3   Title Improved Left knee flexion to 100 degrees actively   Time 3   Period Weeks   Status New            PT Long Term Goals - 08/07/14 1526    PT LONG TERM GOAL #1   Title I with advanced HEP   Time 8   Period Weeks   Status New   PT LONG TERM GOAL #2   Title L knee flexion within 10 degrees of Rt   Time 8   Period Weeks   Status New   PT LONG TERM GOAL #3   Title demo 5/5  LLE strength   Time 8   Period Weeks   Status New   PT LONG TERM GOAL #4   Title climb stairs with a reciprocal gait pattern on stairs without immobilizer   Time 10   Period Weeks   Status New   PT LONG TERM GOAL #5   Title amb with a normal gait pattern without AD    Time 8   Period Weeks   Status New   PT LONG TERM GOAL #6   Title decreased pain by 50% with sitting and standing   Time 8   Period Weeks   Status New   PT LONG TERM GOAL #7   Title improved FOTO to 50% limitation   Time 10   Period Weeks   Status New            Plan - 08/07/14 1724    Clinical Impression Statement Patient presents s/p ACL repair 07/29/14 with pain, decreased ROM, decreaed strength, dependent gait, limited ADL's, inability to work   Pt will benefit from skilled therapeutic intervention in order to improve on the following deficits Abnormal gait;Decreased range of motion;Difficulty walking;Decreased activity tolerance;Pain;Decreased strength;Decreased mobility   Rehab Potential Good   PT Frequency 2x / week   PT Duration 12 weeks   PT Treatment/Interventions ADLs/Self Care Home Management;Moist Heat;Patient/family education;DME Instruction;Therapeutic exercise;Passive range of motion;Ultrasound;Gait training;Manual techniques;Cryotherapy;Stair training;Neuromuscular re-education;Electrical Stimulation   PT Next Visit Plan ROM, quad/hip strength, modalities for pain/edema   PT Home Exercise Plan HEP   Consulted and Agree with Plan of Care Patient        Problem List There are no active problems to display for this patient.   Val Rileselyn P Audrina Marten, PT, MPH 08/07/2014, 5:45 PM  [facility] [address] [place], [place], [address] Phone: [phone]   Fax:  [phone]

## 2014-08-11 ENCOUNTER — Encounter: Payer: PRIVATE HEALTH INSURANCE | Admitting: Physical Therapy

## 2014-08-13 ENCOUNTER — Ambulatory Visit (INDEPENDENT_AMBULATORY_CARE_PROVIDER_SITE_OTHER): Payer: PRIVATE HEALTH INSURANCE | Admitting: Rehabilitative and Restorative Service Providers"

## 2014-08-13 DIAGNOSIS — M7989 Other specified soft tissue disorders: Secondary | ICD-10-CM

## 2014-08-13 DIAGNOSIS — M6281 Muscle weakness (generalized): Secondary | ICD-10-CM

## 2014-08-13 DIAGNOSIS — R262 Difficulty in walking, not elsewhere classified: Secondary | ICD-10-CM

## 2014-08-13 DIAGNOSIS — M25562 Pain in left knee: Secondary | ICD-10-CM

## 2014-08-13 DIAGNOSIS — M25662 Stiffness of left knee, not elsewhere classified: Secondary | ICD-10-CM | POA: Diagnosis not present

## 2014-08-13 NOTE — Patient Instructions (Signed)
Adduction: Hip - Knees Together (Hook-Lying)   Lie with hips and knees bent, towel roll between knees. Push knees together. Hold for _5__ seconds. Rest for _2__ seconds. Repeat _10__ times, 2 sets.. Do _2-3__ times a day. Bracing With Heel Slides (Supine)   With neutral spine, tighten pelvic floor and abdominals and hold. Slide heel to bottom. Repeat _10__ times. Do _3-4__ times a day.   Hamstring Step 2   Left foot relaxed, knee straight, other leg bent, foot flat. Raise straight leg further upward to maximal range. Hold _30__ seconds. Relax leg completely down. Repeat _3__ times.   SELF ASSISTED WITH OBJECT: Hip / Knee Flexion / Extension [place] P[place](Ball)   Place Left heel on therapy ball. Bend and hip and knee to move ball forward. Hold 30 40 sec. 5-10_ reps per set, _2-3__ sets per day, _2-3   EXTENSION: Sitting (Active)   Sit with feet flat. Straighten right knee. LIMIT straightening to 40 degrees Complete __2_ sets of _10_ repetitions. Perform _2-3__ sessions per day.    [facility] [place] [employer]

## 2014-08-13 NOTE — Therapy (Addendum)
[place], [place], [address] Phone: [phone]   Fax:  [phone]  Physical Therapy Treatment  Patient Details  Name: Lindsey Love MRN: [id number] Date of Birth: 12/20/1965 Referring Provider:  Justice Britain, MD  Encounter Date: 08/13/2014      PT End of Session - 08/18/14 1520    Visit Number 3   Number of Visits 24   Date for PT Re-Evaluation 10/30/14   PT Start Time 1520   PT Stop Time 7654   PT Time Calculation (min) 71 min   Activity Tolerance Patient tolerated treatment well;No increased pain   Behavior During Therapy [facility] for tasks assessed/performed      Past Medical History  Diagnosis Date  . CHF (congestive heart failure)   . Hyperlipidemia   . Thyroid disease     Past Surgical History  Procedure Laterality Date  . Abdominal hysterectomy      There were no vitals filed for this visit.  Visit Diagnosis:  Pain in left knee  Stiffness of knee joint, left  Swelling of limb  Generalized muscle weakness  Difficulty walking      Subjective Assessment - 08/18/14 1527    Subjective Not taking any Rx pain meds; makingprogress; waking in her home without assistive device; working on HEP   Limitations Sitting   How long can you sit comfortably? 30 min   How long can you stand comfortably? 69mnutes   How long can you walk comfortably? 10 min   Diagnostic tests MRI showed ACL tear    Currently in Pain? Yes   Pain Score 4    Pain Location Knee   Pain Orientation Left   Pain Descriptors / Indicators Tingling;Throbbing;Tightness   Pain Type Acute pain;Surgical pain   Pain Onset More than a month ago   Pain Frequency Intermittent   Aggravating Factors  lying with LE unsupported; tires with activities   Pain Relieving Factors Ice; rest   Effect of Pain on Daily Activities unable to work; difficulty with standing and walking; 40 stairs to enter/leave apartment - will be  moving to first floor apartment soon   Multiple Pain Sites No          [facility] PT Assessment - 08/18/14 0001    AROM   Right/Left Knee Left   Left Knee Extension -2   Left Knee Flexion 95             [facility] Adult PT Treatment/Exercise - 08/18/14 0001    Ambulation/Gait   Ambulation/Gait Assistance --  Gait without assistive device 30 feet x4 WB to tol   Knee/Hip Exercises: Supine   Quad Sets Strengthening;Left;2 sets;5 reps   Heel Slides AAROM;3 sets;5 sets   Straight Leg Raises Strengthening;Left;2 sets;5 sets  assisted by PT   Straight Leg Raise with External Rotation Strengthening;Left;2 sets;5 reps   Knee/Hip Exercises: Sidelying   Hip ABduction Strengthening;Left;2 sets;5 reps;10 reps   Hip ADduction Strengthening;2 sets;10 reps  with ball tn knees   Knee/Hip Exercises: Prone   Hip Extension Strengthening;2 sets;10 reps   Modalities   Modalities Cryotherapy   Cryotherapy   Number Minutes Cryotherapy 15 Minutes   Cryotherapy Location Knee   Type of Cryotherapy Ice pack   Electrical Stimulation   Electrical Stimulation Location lt knee   Electrical Stimulation Action IFC   Electrical Stimulation Parameters to tolerance   Electrical Stimulation Goals Edema;Pain   Vasopneumatic   Number Minutes Vasopneumatic  15 minutes  Vasopnuematic Location  Knee   Vasopneumatic Pressure Medium   Vasopneumatic Temperature  3*           PT Education - 08/18/14 1625    Education provided Yes   Education Details HEP   Person(s) Educated Patient   Methods Explanation;Demonstration;Tactile cues;Verbal cues;Handout   Comprehension Verbalized understanding          PT Short Term Goals - 08/18/14 1554    PT SHORT TERM GOAL #1   Title I with initial HEP   Time 3   Period Weeks   Status Achieved   PT SHORT TERM GOAL #2   Title able to perform a solid quad contraction and SLR on L with no lag   Time 3   Period Weeks   Status Partially Met   PT SHORT TERM GOAL #3    Title Improved Left knee flexion to 100 degrees actively   Status On-going           PT Long Term Goals - 08/13/14 1640    PT LONG TERM GOAL #1   Title I with advanced HEP   Time 8   Period Weeks   Status On-going   PT LONG TERM GOAL #2   Title L knee flexion within 10 degrees of Rt   Time 8   Period Weeks   Status On-going   PT LONG TERM GOAL #3   Title demo 5/5 LLE strength   Time 8   Period Weeks   Status On-going   PT LONG TERM GOAL #4   Title climb stairs with a reciprocal gait pattern on stairs without immobilizer   Time 10   Period Weeks   Status Not Met   PT LONG TERM GOAL #5   Title amb with a normal gait pattern without AD    Time 8   Period Weeks   Status Not Met   PT LONG TERM GOAL #6   Title decreased pain by 50% with sitting and standing   Time 8   Status Not Met   PT LONG TERM GOAL #7   Title improved FOTO to 50% limitation   Time 10   Period Weeks   Status On-going             Plan - 08/18/14 1551    Clinical Impression Statement Patient is progressing well with HEP and treatment. She is ambulating without assistive device in her home in splint. She uses one crutch up and down stairs and when outside. ROM continues to improve.   Pt will benefit from skilled therapeutic intervention in order to improve on the following deficits Abnormal gait;Decreased range of motion;Difficulty walking;Decreased activity tolerance;Pain;Decreased strength;Decreased mobility   Rehab Potential Good   PT Frequency 2x / week   PT Duration 12 weeks   PT Treatment/Interventions ADLs/Self Care Home Management;Moist Heat;Patient/family education;DME Instruction;Therapeutic exercise;Passive range of motion;Ultrasound;Gait training;Manual techniques;Cryotherapy;Stair training;Neuromuscular re-education;Electrical Stimulation   PT Next Visit Plan ROM, quad/hip strength, modalities for pain/edema   PT Home Exercise Plan Added prone knee flexion; standing activities    Consulted and Agree with Plan of Care Patient        Problem List There are no active problems to display for this patient.   Everardo All, PT, MPH 08/18/2014, 4:35 PM  [facility] [place], [place], [address] Phone: [phone]   Fax:  [phone]

## 2014-08-14 ENCOUNTER — Encounter: Payer: PRIVATE HEALTH INSURANCE | Admitting: Rehabilitative and Restorative Service Providers"

## 2014-08-18 ENCOUNTER — Ambulatory Visit (INDEPENDENT_AMBULATORY_CARE_PROVIDER_SITE_OTHER): Payer: PRIVATE HEALTH INSURANCE | Admitting: Rehabilitative and Restorative Service Providers"

## 2014-08-18 ENCOUNTER — Encounter: Payer: Self-pay | Admitting: Rehabilitative and Restorative Service Providers"

## 2014-08-18 DIAGNOSIS — M25662 Stiffness of left knee, not elsewhere classified: Secondary | ICD-10-CM | POA: Diagnosis not present

## 2014-08-18 DIAGNOSIS — M7989 Other specified soft tissue disorders: Secondary | ICD-10-CM

## 2014-08-18 DIAGNOSIS — M6281 Muscle weakness (generalized): Secondary | ICD-10-CM

## 2014-08-18 DIAGNOSIS — M25562 Pain in left knee: Secondary | ICD-10-CM | POA: Diagnosis not present

## 2014-08-18 DIAGNOSIS — R262 Difficulty in walking, not elsewhere classified: Secondary | ICD-10-CM

## 2014-08-18 NOTE — Therapy (Addendum)
[place], [place], [address] Phone: [phone]   Fax:  [phone]  Physical Therapy Treatment  Patient Details  Name: Lindsey Love MRN: [id number] Date of Birth: Jun 04, 1965 Referring Provider:  Justice Britain, MD  Encounter Date: 08/18/2014      PT End of Session - 08/18/14 1520    Visit Number 3   Number of Visits 24   Date for PT Re-Evaluation 10/30/14   PT Start Time 1520   PT Stop Time 6962   PT Time Calculation (min) 71 min   Activity Tolerance Patient tolerated treatment well;No increased pain   Behavior During Therapy [facility] for tasks assessed/performed      Past Medical History  Diagnosis Date  . CHF (congestive heart failure)   . Hyperlipidemia   . Thyroid disease     Past Surgical History  Procedure Laterality Date  . Abdominal hysterectomy      There were no vitals filed for this visit.  Visit Diagnosis:  Pain in left knee  Stiffness of knee joint, left  Swelling of limb  Generalized muscle weakness  Difficulty walking      Subjective Assessment - 08/18/14 1527    Subjective Not taking any Rx pain meds; makingprogress; waking in her home without assistive device; working on HEP   Limitations Sitting   How long can you sit comfortably? 30 min   How long can you stand comfortably? 10mnutes   How long can you walk comfortably? 10 min   Diagnostic tests MRI showed ACL tear    Currently in Pain? Yes   Pain Score 4    Pain Location Knee   Pain Orientation Left   Pain Descriptors / Indicators Tingling;Throbbing;Tightness   Pain Type Acute pain;Surgical pain   Pain Onset More than a month ago   Pain Frequency Intermittent   Aggravating Factors  lying with LE unsupported; tires with activities   Pain Relieving Factors Ice; rest   Effect of Pain on Daily Activities unable to work; difficulty with standing and walking; 40 stairs to enter/leave apartment - will be  moving to first floor apartment soon   Multiple Pain Sites No            [facility] PT Assessment - 08/18/14 0001    AROM   Right/Left Knee Left   Left Knee Extension -2   Left Knee Flexion 95           [facility] Adult PT Treatment/Exercise - 08/18/14 0001    Ambulation/Gait   Ambulation/Gait Assistance --  Gait without assistive device 30 feet x4 WB to tol   Knee/Hip Exercises: Standing   Other Standing Knee Exercises Wt shift standing side to side and forward and back/mid stance with each foot forward ~1 min each   Knee/Hip Exercises: Supine   Quad Sets Strengthening;Left;2 sets;5 reps   Heel Slides AAROM;3 sets;5 sets   Straight Leg Raises Strengthening;Left;2 sets;5 sets  assisted by PT   Straight Leg Raise with External Rotation Strengthening;Left;2 sets;5 reps   Knee/Hip Exercises: Sidelying   Hip ABduction Strengthening;Left;2 sets;5 reps;10 reps   Hip ADduction Strengthening;2 sets;10 reps  with ball tn knees   Knee/Hip Exercises: Prone   Hamstring Curl 5 reps;2 seconds;3 seconds   Hip Extension Strengthening;2 sets;10 reps   Modalities   Modalities Cryotherapy   Cryotherapy   Number Minutes Cryotherapy 15 Minutes   Cryotherapy Location Knee   Type of Cryotherapy Ice pack;Other (comment)   Electrical  Stimulation   Electrical Stimulation Location lt knee   Electrical Stimulation Action IFC   Electrical Stimulation Parameters to tolerance   Electrical Stimulation Goals Edema;Pain   Vasopneumatic   Number Minutes Vasopneumatic  15 minutes   Vasopnuematic Location  Knee   Vasopneumatic Pressure Medium   Vasopneumatic Temperature  3*   Manual Therapy   Manual therapy comments Instructed in scar massage to begin ~5 min 1-2 x/day            PT Education - 08/18/14 1625    Education provided Yes   Education Details HEP   Person(s) Educated Patient   Methods Explanation;Demonstration;Tactile cues;Verbal cues;Handout   Comprehension Verbalized understanding           PT Short Term Goals - 08/18/14 1554    PT SHORT TERM GOAL #1   Title I with initial HEP   Time 3   Period Weeks   Status Achieved   PT SHORT TERM GOAL #2   Title able to perform a solid quad contraction and SLR on L with no lag   Time 3   Period Weeks   Status Partially Met   PT SHORT TERM GOAL #3   Title Improved Left knee flexion to 100 degrees actively   Status On-going           PT Long Term Goals - 08/13/14 1640    PT LONG TERM GOAL #1   Title I with advanced HEP   Time 8   Period Weeks   Status On-going   PT LONG TERM GOAL #2   Title L knee flexion within 10 degrees of Rt   Time 8   Period Weeks   Status On-going   PT LONG TERM GOAL #3   Title demo 5/5 LLE strength   Time 8   Period Weeks   Status On-going   PT LONG TERM GOAL #4   Title climb stairs with a reciprocal gait pattern on stairs without immobilizer   Time 10   Period Weeks   Status Not Met   PT LONG TERM GOAL #5   Title amb with a normal gait pattern without AD    Time 8   Period Weeks   Status Not Met   PT LONG TERM GOAL #6   Title decreased pain by 50% with sitting and standing   Time 8   Status Not Met   PT LONG TERM GOAL #7   Title improved FOTO to 50% limitation   Time 10   Period Weeks   Status On-going           Plan - 08/18/14 1551    Clinical Impression Statement Patient is progressing well with HEP and treatment. She is ambulating without assistive device in her home in splint. She uses one crutch up and down stairs and when outside. ROM continues to improve.   Pt will benefit from skilled therapeutic intervention in order to improve on the following deficits Abnormal gait;Decreased range of motion;Difficulty walking;Decreased activity tolerance;Pain;Decreased strength;Decreased mobility   Rehab Potential Good   PT Frequency 2x / week   PT Duration 12 weeks   PT Treatment/Interventions ADLs/Self Care Home Management;Moist Heat;Patient/family education;DME  Instruction;Therapeutic exercise;Passive range of motion;Ultrasound;Gait training;Manual techniques;Cryotherapy;Stair training;Neuromuscular re-education;Electrical Stimulation   PT Next Visit Plan ROM, quad/hip strength, modalities for pain/edema   PT Home Exercise Plan Added prone knee flexion; standing activities   Consulted and Agree with Plan of Care Patient  Problem List There are no active problems to display for this patient.   Everardo All, PT, MPH 08/18/2014, 4:43 PM  [facility] [place], [place], [address] Phone: [phone]   Fax:  [phone]

## 2014-08-18 NOTE — Patient Instructions (Signed)
KNEE: Flexion - Prone   Bend knee. Raise heel toward buttocks. Do not raise hips. _10__ reps per set, _1-2__ sets per day, _2__ days per week     Standing with splint on Left leg. Hold with arms lightly for support.  Shift weight side to side. Step one foot forward. Shift weight forward and back. Switch forward foot. Repeat.  1-2 minutes each.   Scar massage - 5 minutes to each site 1-2 times/day

## 2014-08-21 ENCOUNTER — Encounter: Payer: PRIVATE HEALTH INSURANCE | Admitting: Physical Therapy

## 2014-08-25 ENCOUNTER — Encounter: Payer: PRIVATE HEALTH INSURANCE | Admitting: Rehabilitative and Restorative Service Providers"

## 2014-08-26 ENCOUNTER — Ambulatory Visit (INDEPENDENT_AMBULATORY_CARE_PROVIDER_SITE_OTHER): Payer: PRIVATE HEALTH INSURANCE | Admitting: Physical Therapy

## 2014-08-26 DIAGNOSIS — M25662 Stiffness of left knee, not elsewhere classified: Secondary | ICD-10-CM

## 2014-08-26 DIAGNOSIS — M25562 Pain in left knee: Secondary | ICD-10-CM | POA: Diagnosis not present

## 2014-08-26 DIAGNOSIS — R262 Difficulty in walking, not elsewhere classified: Secondary | ICD-10-CM

## 2014-08-26 DIAGNOSIS — M6281 Muscle weakness (generalized): Secondary | ICD-10-CM | POA: Diagnosis not present

## 2014-08-26 DIAGNOSIS — M7989 Other specified soft tissue disorders: Secondary | ICD-10-CM

## 2014-08-26 NOTE — Therapy (Signed)
[facility] [place] [address] [place], [place], [address] Phone: [phone]   Fax:  [phone]  Physical Therapy Treatment  Patient Details  Name: Lindsey Love MRN: [id number] Date of Birth: [birth year] Referring Provider:  Francena Hanly, MD  Encounter Date: 08/26/2014      PT End of Session - 08/26/14 0952    Visit Number 4   Number of Visits 24   Date for PT Re-Evaluation 10/30/14   PT Start Time 0935   Activity Tolerance Patient limited by pain      Past Medical History  Diagnosis Date  . CHF (congestive heart failure)   . Hyperlipidemia   . Thyroid disease     Past Surgical History  Procedure Laterality Date  . Abdominal hysterectomy      There were no vitals filed for this visit.  Visit Diagnosis:  Pain in left knee  Stiffness of knee joint, left  Swelling of limb  Generalized muscle weakness  Difficulty walking      Subjective Assessment - 08/26/14 0941    Subjective Pt reports the MD told her she needs to stay in the immobilizer for two more weeks and then change to hinged brace. Yesterday her dog pulled her and she fell, the pain and swelling is back up.    How long can you sit comfortably? 30 min   How long can you stand comfortably?   How long can you walk comfortably? 10 min   Diagnostic tests MRI showed ACL tear    Currently in Pain? Yes   Pain Score 9    Pain Location Knee   Pain Orientation Left   Pain Descriptors / Indicators Burning;Spasm;Sore;Throbbing   Pain Type Surgical pain;Acute pain   Pain Onset More than a month ago   Pain Frequency Constant   Aggravating Factors  fallling   Pain Relieving Factors freezing the knee            [facility] PT Assessment - 08/26/14 0001    Assessment   Medical Diagnosis Lt knee ACL tear   Onset Date/Surgical Date 07/29/14   Next MD Visit 09/05/14  however will be changed due to moving    AROM   AROM Assessment Site Knee    Right/Left Knee Left   Left Knee Flexion 100                     [facility] Adult PT Treatment/Exercise - 08/26/14 0001    Knee/Hip Exercises: Seated   Heel Slides Left;2 sets;10 reps   Knee/Hip Exercises: Supine   Bridges 10 reps;Both;Strengthening   Knee Extension Strengthening;Left;10 reps   Modalities   Modalities Ultrasound;Network engineer Parameters to tolerance   Electrical Stimulation Goals Edema;Pain   Ultrasound   Ultrasound Location anterior Lt knee   Ultrasound Parameters 100%. 3.3 mhz, 1.5 w/cm2   Ultrasound Goals Pain   Vasopneumatic   Number Minutes Vasopneumatic  15 minutes   Vasopnuematic Location  Knee   Vasopneumatic Pressure Medium   Vasopneumatic Temperature  3*                  PT Short Term Goals - 08/26/14 1009    PT SHORT TERM GOAL #1   Title I with initial HEP   Status Achieved   PT SHORT TERM GOAL #2   Title able to  perform a solid quad contraction and SLR on L with no lag   Status On-going   PT SHORT TERM GOAL #3   Title Improved Left knee flexion to 100 degrees actively   Status Achieved           PT Long Term Goals - 08/26/14 1010    PT LONG TERM GOAL #1   Title I with advanced HEP   Status On-going   PT LONG TERM GOAL #2   Title L knee flexion within 10 degrees of Rt   Status On-going   PT LONG TERM GOAL #3   Title demo 5/5 LLE strength   Status On-going   PT LONG TERM GOAL #4   Title climb stairs with a reciprocal gait pattern on stairs without immobilizer   Status On-going   PT LONG TERM GOAL #5   Title amb with a normal gait pattern without AD    Status On-going   PT LONG TERM GOAL #6   Title decreased pain by 50% with sitting and standing   Status On-going   PT LONG TERM GOAL #7   Title improved FOTO to 50% limitation   Status On-going                Plan - 08/26/14 1010    Clinical Impression Statement Pt s/p a fall over the weekend that has increased her pain and edema. The swelling is down into the ankle today.  She continues to show increased ROM even with the fall. She is not clear on the use of her new barce.    Pt will benefit from skilled therapeutic intervention in order to improve on the following deficits Abnormal gait;Decreased range of motion;Difficulty walking;Decreased activity tolerance;Pain;Decreased strength;Decreased mobility   Rehab Potential Good   PT Frequency 2x / week   PT Duration 12 weeks   PT Treatment/Interventions ADLs/Self Care Home Management;Moist Heat;Patient/family education;DME Instruction;Therapeutic exercise;Passive range of motion;Ultrasound;Gait training;Manual techniques;Cryotherapy;Stair training;Neuromuscular re-education;Electrical Stimulation   PT Next Visit Plan continue ROM, strength and see if MD has responded on use of the brace, add in bike   Consulted and Agree with Plan of Care Patient        Problem List There are no active problems to display for this patient.   Roderic Scarce, PT 08/26/2014, 10:19 AM  [facility] [address] [place], [place], [address] Phone: [phone]   Fax:  [phone]

## 2014-08-28 ENCOUNTER — Ambulatory Visit (INDEPENDENT_AMBULATORY_CARE_PROVIDER_SITE_OTHER): Payer: PRIVATE HEALTH INSURANCE | Admitting: Physical Therapy

## 2014-08-28 DIAGNOSIS — M25562 Pain in left knee: Secondary | ICD-10-CM | POA: Diagnosis not present

## 2014-08-28 DIAGNOSIS — M7989 Other specified soft tissue disorders: Secondary | ICD-10-CM

## 2014-08-28 DIAGNOSIS — M6281 Muscle weakness (generalized): Secondary | ICD-10-CM | POA: Diagnosis not present

## 2014-08-28 DIAGNOSIS — R262 Difficulty in walking, not elsewhere classified: Secondary | ICD-10-CM

## 2014-08-28 DIAGNOSIS — M25662 Stiffness of left knee, not elsewhere classified: Secondary | ICD-10-CM | POA: Diagnosis not present

## 2014-08-28 NOTE — Therapy (Signed)
[facility] [place] [address] [place], [place], [address] Phone: [phone]   Fax:  [phone]  Physical Therapy Treatment  Patient Details  Name: Lindsey Love MRN: [id number] Date of Birth: 02-07-1966 Referring Provider:  Francena Hanly, MD  Encounter Date: 08/28/2014      PT End of Session - 08/28/14 1626    Visit Number 5   Number of Visits 24   Date for PT Re-Evaluation 10/30/14   PT Start Time 1621   PT Stop Time 1717   PT Time Calculation (min) 56 min      Past Medical History  Diagnosis Date  . CHF (congestive heart failure)   . Hyperlipidemia   . Thyroid disease     Past Surgical History  Procedure Laterality Date  . Abdominal hysterectomy      There were no vitals filed for this visit.  Visit Diagnosis:  Pain in left knee  Stiffness of knee joint, left  Swelling of limb  Generalized muscle weakness  Difficulty walking      Subjective Assessment - 08/28/14 1627    Subjective Pt reports Lt knee still feels swollen, unchanged pain.    Currently in Pain? Yes   Pain Score 6    Pain Location Knee   Pain Orientation Left   Pain Descriptors / Indicators Sore;Sharp;Aching   Aggravating Factors  standing, walking, stairs    Pain Relieving Factors ice application             [facility] PT Assessment - 08/28/14 0001    Assessment   Medical Diagnosis Lt knee ACL tear; repair   Onset Date/Surgical Date 07/29/14   Next MD Visit 09/05/14  however will be changed due to moving    AROM   AROM Assessment Site Knee   Right/Left Knee Left   Left Knee Flexion 102  on recumbent bike                     [facility] Adult PT Treatment/Exercise - 08/28/14 0001    Knee/Hip Exercises: Aerobic   Stationary Bike NuStep for ROM x 5 min; 1/2 revolutions on bike x 5 min   Knee/Hip Exercises: Standing   Terminal Knee Extension Left;1 set;10 reps  5 sec hold with ball behind knee   Wall Squat 1 set;15  reps  (only 30-40 deg flexion)   Knee/Hip Exercises: Seated   Long Arc Quad Strengthening;Left;1 set;10 reps;Weights  5 sec hold in ext.    Long Arc Quad Weight 1 lbs.   Knee/Hip Exercises: Supine   Other Supine Knee/Hip Exercises Hamstring curl isometrics at 60 deg - 5 sec hold x 10 reps    Modalities   Modalities Vasopneumatic;Electrical Stimulation   Electrical Stimulation   Electrical Stimulation Location lt knee   Electrical Stimulation Action ion repelling   Electrical Stimulation Parameters to tolerance    Electrical Stimulation Goals Edema;Pain   Vasopneumatic   Number Minutes Vasopneumatic  15 minutes   Vasopnuematic Location  Knee   Vasopneumatic Pressure Medium   Vasopneumatic Temperature  3*                PT Education - 08/28/14 1706    Education provided Yes   Education Details HEP- educated pt on activities/ restrictions within ACL protocol for wk 4.     Person(s) Educated Patient   Methods Explanation   Comprehension Verbalized understanding          PT Short Term Goals -  08/26/14 1009    PT SHORT TERM GOAL #1   Title I with initial HEP   Status Achieved   PT SHORT TERM GOAL #2   Title able to perform a solid quad contraction and SLR on L with no lag   Status On-going   PT SHORT TERM GOAL #3   Title Improved Left knee flexion to 100 degrees actively   Status Achieved           PT Long Term Goals - 08/26/14 1010    PT LONG TERM GOAL #1   Title I with advanced HEP   Status On-going   PT LONG TERM GOAL #2   Title L knee flexion within 10 degrees of Rt   Status On-going   PT LONG TERM GOAL #3   Title demo 5/5 LLE strength   Status On-going   PT LONG TERM GOAL #4   Title climb stairs with a reciprocal gait pattern on stairs without immobilizer   Status On-going   PT LONG TERM GOAL #5   Title amb with a normal gait pattern without AD    Status On-going   PT LONG TERM GOAL #6   Title decreased pain by 50% with sitting and standing    Status On-going   PT LONG TERM GOAL #7   Title improved FOTO to 50% limitation   Status On-going               Plan - 08/28/14 1803    Clinical Impression Statement Pt tolerated all new exercises without increase in pain.  Pt demo slight increase in Lt knee ROM with bike. Pt reports she is interested in getting in pool (walking) and on bike at her apt complex. Pt progressing towards goals.    Pt will benefit from skilled therapeutic intervention in order to improve on the following deficits Abnormal gait;Decreased range of motion;Difficulty walking;Decreased activity tolerance;Pain;Decreased strength;Decreased mobility   Rehab Potential Good   PT Frequency 2x / week   PT Duration 12 weeks   PT Treatment/Interventions ADLs/Self Care Home Management;Moist Heat;Patient/family education;DME Instruction;Therapeutic exercise;Passive range of motion;Ultrasound;Gait training;Manual techniques;Cryotherapy;Stair training;Neuromuscular re-education;Electrical Stimulation   PT Next Visit Plan continue ROM, strength and see if MD has responded on use of the brace.    Consulted and Agree with Plan of Care Patient        Problem List There are no active problems to display for this patient.   Mayer Camel, PTA 08/28/2014 6:05 PM  [facility] [place] [address] [place], [place], [address] Phone: [phone]   Fax:  [phone]

## 2014-09-01 ENCOUNTER — Ambulatory Visit (INDEPENDENT_AMBULATORY_CARE_PROVIDER_SITE_OTHER): Payer: PRIVATE HEALTH INSURANCE | Admitting: Physical Therapy

## 2014-09-01 DIAGNOSIS — M7989 Other specified soft tissue disorders: Secondary | ICD-10-CM | POA: Diagnosis not present

## 2014-09-01 DIAGNOSIS — M25562 Pain in left knee: Secondary | ICD-10-CM

## 2014-09-01 DIAGNOSIS — M25662 Stiffness of left knee, not elsewhere classified: Secondary | ICD-10-CM | POA: Diagnosis not present

## 2014-09-01 DIAGNOSIS — R262 Difficulty in walking, not elsewhere classified: Secondary | ICD-10-CM

## 2014-09-01 DIAGNOSIS — M6281 Muscle weakness (generalized): Secondary | ICD-10-CM | POA: Diagnosis not present

## 2014-09-01 NOTE — Therapy (Signed)
[place], [place], [address] Phone: [phone]   Fax:  [phone]  Physical Therapy Treatment  Patient Details  Name: Lindsey Love MRN: [id number] Date of Birth: 1965-10-27 Referring Provider:  Justice Britain, MD  Encounter Date: 09/01/2014      PT End of Session - 09/01/14 1518    Visit Number 6   Number of Visits 24   Date for PT Re-Evaluation 10/30/14   PT Start Time 5638      Past Medical History  Diagnosis Date  . CHF (congestive heart failure)   . Hyperlipidemia   . Thyroid disease     Past Surgical History  Procedure Laterality Date  . Abdominal hysterectomy      There were no vitals filed for this visit.  Visit Diagnosis:  Pain in left knee  Stiffness of knee joint, left  Swelling of limb  Generalized muscle weakness  Difficulty walking      Subjective Assessment - 09/01/14 1523    Subjective Pt reports she got into pool for walking; been gently working on ROM.  Presents with slight irritation to lateral incision (pink) on Lt knee.    Currently in Pain? No/denies            [facility] PT Assessment - 09/01/14 0001    Assessment   Medical Diagnosis Lt knee ACL tear; repair   Onset Date/Surgical Date 07/29/14   Next MD Visit 09/05/14   AROM   AROM Assessment Site Knee   Right/Left Knee Left   Left Knee Extension 0   Left Knee Flexion 104                     [facility] Adult PT Treatment/Exercise - 09/01/14 0001    Knee/Hip Exercises: Stretches   Passive Hamstring Stretch Left;2 reps;20 seconds  (Followed by adductor stretch with strap, 20 sec, 2 reps)   ITB Stretch Left;2 reps;20 seconds   Knee/Hip Exercises: Aerobic   Stationary Bike Full revolutions on bike for ROM x 7 min    Knee/Hip Exercises: Standing   Terminal Knee Extension Left;2 sets;10 reps  5 sec hold in ext.    Theraband Level (Terminal Knee Extension) Level 2 (Red)   Wall Squat 2  sets;10 reps  (within protocol- 40 deg flex)   Knee/Hip Exercises: Seated   Long Arc Quad Strengthening;Left;10 reps;2 sets   Long Arc Quad Weight 2 lbs.   Knee/Hip Exercises: Supine   Straight Leg Raise with External Rotation Strengthening;Left;2 sets;10 reps   Modalities   Modalities Vasopneumatic;Electrical Stimulation   Electrical Stimulation   Electrical Stimulation Location lt knee   Electrical Stimulation Action ion repelling    Electrical Stimulation Parameters to tolerance x 15 min    Electrical Stimulation Goals Edema;Pain   Vasopneumatic   Number Minutes Vasopneumatic  15 minutes   Vasopnuematic Location  Knee   Vasopneumatic Pressure Medium   Vasopneumatic Temperature  3*                  PT Short Term Goals - 08/26/14 1009    PT SHORT TERM GOAL #1   Title I with initial HEP   Status Achieved   PT SHORT TERM GOAL #2   Title able to perform a solid quad contraction and SLR on L with no lag   Status On-going   PT SHORT TERM GOAL #3   Title Improved Left knee flexion to 100 degrees actively  Status Achieved           PT Long Term Goals - 09/01/14 1542    PT LONG TERM GOAL #1   Title I with advanced HEP   Time 8   Period Weeks   Status On-going   PT LONG TERM GOAL #2   Title L knee flexion within 10 degrees of Rt   Time 8   Period Weeks   Status On-going  09/01/14: Rt: 142, Lt: 104.    PT LONG TERM GOAL #3   Title demo 5/5 LLE strength   Time 8   Period Weeks   PT LONG TERM GOAL #4   Title climb stairs with a reciprocal gait pattern on stairs without immobilizer   Time 10   Period Weeks   Status On-going   PT LONG TERM GOAL #5   Title amb with a normal gait pattern without AD    Time 8   Period Weeks   Status On-going   PT LONG TERM GOAL #6   Title decreased pain by 50% with sitting and standing   Time 8   Period Weeks   Status Achieved   PT LONG TERM GOAL #7   Title improved FOTO to 50% limitation   Time 10   Period Weeks    Status On-going               Plan - 09/01/14 1535    Clinical Impression Statement Pt able to complete full revolutions on bicycle today; improved Lt knee ROM.  Able to tolerate increased resistance with exercise.   Progressing well through ACL protocol. Has met STG 2, LTG 6.    Pt will benefit from skilled therapeutic intervention in order to improve on the following deficits Abnormal gait;Decreased range of motion;Difficulty walking;Decreased activity tolerance;Pain;Decreased strength;Decreased mobility   Rehab Potential Good   PT Frequency 2x / week   PT Duration 12 weeks   PT Treatment/Interventions ADLs/Self Care Home Management;Moist Heat;Patient/family education;DME Instruction;Therapeutic exercise;Passive range of motion;Ultrasound;Gait training;Manual techniques;Cryotherapy;Stair training;Neuromuscular re-education;Electrical Stimulation   PT Next Visit Plan continue ROM, strength and write MD note for upcoming appt (July 1)    Consulted and Agree with Plan of Care Patient        Problem List There are no active problems to display for this patient.   Shelbie Hutching 09/01/2014, 4:00 PM  [facility] [place], [place], [address] Phone: [phone]   Fax:  [phone]

## 2014-09-04 ENCOUNTER — Encounter (HOSPITAL_BASED_OUTPATIENT_CLINIC_OR_DEPARTMENT_OTHER): Payer: Self-pay | Admitting: *Deleted

## 2014-09-04 ENCOUNTER — Encounter: Payer: Self-pay | Admitting: Physical Therapy

## 2014-09-04 ENCOUNTER — Emergency Department (HOSPITAL_BASED_OUTPATIENT_CLINIC_OR_DEPARTMENT_OTHER)
Admission: EM | Admit: 2014-09-04 | Discharge: 2014-09-04 | Disposition: A | Discharge status: Home / Self Care | Payer: PRIVATE HEALTH INSURANCE | Attending: Emergency Medicine | Admitting: Emergency Medicine

## 2014-09-04 ENCOUNTER — Emergency Department (HOSPITAL_BASED_OUTPATIENT_CLINIC_OR_DEPARTMENT_OTHER): Payer: PRIVATE HEALTH INSURANCE

## 2014-09-04 ENCOUNTER — Encounter: Payer: PRIVATE HEALTH INSURANCE | Admitting: Physical Therapy

## 2014-09-04 DIAGNOSIS — G8918 Other acute postprocedural pain: Secondary | ICD-10-CM | POA: Diagnosis not present

## 2014-09-04 DIAGNOSIS — M25562 Pain in left knee: Secondary | ICD-10-CM | POA: Insufficient documentation

## 2014-09-04 DIAGNOSIS — R509 Fever, unspecified: Secondary | ICD-10-CM | POA: Diagnosis not present

## 2014-09-04 DIAGNOSIS — Z9889 Other specified postprocedural states: Secondary | ICD-10-CM | POA: Diagnosis not present

## 2014-09-04 DIAGNOSIS — R0602 Shortness of breath: Secondary | ICD-10-CM | POA: Diagnosis not present

## 2014-09-04 DIAGNOSIS — Z8619 Personal history of other infectious and parasitic diseases: Secondary | ICD-10-CM | POA: Insufficient documentation

## 2014-09-04 DIAGNOSIS — Z79899 Other long term (current) drug therapy: Secondary | ICD-10-CM | POA: Diagnosis not present

## 2014-09-04 DIAGNOSIS — Z880 Allergy status to penicillin: Secondary | ICD-10-CM | POA: Insufficient documentation

## 2014-09-04 DIAGNOSIS — I509 Heart failure, unspecified: Secondary | ICD-10-CM | POA: Insufficient documentation

## 2014-09-04 DIAGNOSIS — E079 Disorder of thyroid, unspecified: Secondary | ICD-10-CM | POA: Diagnosis not present

## 2014-09-04 DIAGNOSIS — R5383 Other fatigue: Secondary | ICD-10-CM | POA: Diagnosis present

## 2014-09-04 LAB — BASIC METABOLIC PANEL
Anion gap: 8 (ref 5–15)
BUN: 18 mg/dL (ref 6–20)
CALCIUM: 8.8 mg/dL — AB (ref 8.9–10.3)
CHLORIDE: 108 mmol/L (ref 101–111)
CO2: 26 mmol/L (ref 22–32)
CREATININE: 0.64 mg/dL (ref 0.44–1.00)
GFR calc Af Amer: >60 mL/min (ref >60)
GFR calc non Af Amer: >60 mL/min (ref >60)
Glucose, Bld: 111 mg/dL — ABNORMAL HIGH (ref 65–99)
POTASSIUM: 3.5 mmol/L (ref 3.5–5.1)
SODIUM: 142 mmol/L (ref 135–145)

## 2014-09-04 LAB — CBC
HEMATOCRIT: 33.8 % — AB (ref 36.0–46.0)
Hemoglobin: 11.3 g/dL — ABNORMAL LOW (ref 12.0–15.0)
MCH: 32.6 pg (ref 26.0–34.0)
MCHC: 33.4 g/dL (ref 30.0–36.0)
MCV: 97.4 fL (ref 78.0–100.0)
PLATELETS: 195 10*3/uL (ref 150–400)
RBC: 3.47 MIL/uL — ABNORMAL LOW (ref 3.87–5.11)
RDW: 12.4 % (ref 11.5–15.5)
WBC: 6.4 10*3/uL (ref 4.0–10.5)

## 2014-09-04 LAB — BRAIN NATRIURETIC PEPTIDE: B Natriuretic Peptide: 36.2 pg/mL (ref 0.0–100.0)

## 2014-09-04 LAB — SEDIMENTATION RATE: Sed Rate: 10 mm/hr (ref 0–22)

## 2014-09-04 LAB — TROPONIN I: Troponin I: <0.03 ng/mL (ref <0.031)

## 2014-09-04 MED ORDER — CEPHALEXIN 500 MG PO CAPS: 500.0000 mg | ORAL_CAPSULE | Freq: Four times a day (QID) | ORAL | Status: DC

## 2014-09-04 MED ORDER — DOCUSATE SODIUM 100 MG PO CAPS
ORAL_CAPSULE | ORAL | Status: AC
  Filled 2014-09-04: qty 1

## 2014-09-04 NOTE — ED Provider Notes (Signed)
CSN: [id number]     Arrival date & time 09/04/14  1703 History   First MD Initiated Contact with Patient 09/04/14 1722     Chief Complaint  Patient presents with  . Wound Infection     (Consider location/radiation/quality/duration/timing/severity/associated sxs/prior Treatment) HPI Comments: Left knee pain: Began today. Went swimming a few days ago now the lateral incision from her surgery is mildly red. It is not draining. She had left arthroscopic donor anterior cruciate ligament repair by Dr. Rennis Chris with [facility] orthopedics.  Shortness of breath: Began a few days ago. States she feels that she might be, "septic" she has history of being septic from an unknown source. She states a mild shortness of breath today with generalized fatigue for several days. She's had a low-grade fever on and off but has not checked her temperature. No CP, dysuria, vomiting, diarrhea.  Patient is a 49 y.o. female presenting with knee pain. The history is provided by the patient.  Knee Pain Location:  Knee Time since incident:  1 day Injury: no   Knee location:  L knee Pain details:    Quality:  Aching   Radiates to:  Does not radiate   Severity:  Moderate   Onset quality:  Gradual   Timing:  Constant   Progression:  Unchanged Chronicity:  New Relieved by:  Nothing Worsened by:  Nothing tried Associated symptoms: fatigue and fever (off/on low grade)     Past Medical History  Diagnosis Date  . CHF (congestive heart failure)   . Hyperlipidemia   . Thyroid disease    Past Surgical History  Procedure Laterality Date  . Abdominal hysterectomy     Family History  Problem Relation Age of Onset  . Cancer Mother    History  Substance Use Topics  . Smoking status: Former Games developer  . Smokeless tobacco: Never Used  . Alcohol Use: No   OB History    No data available     Review of Systems  Constitutional: Positive for fever (off/on low grade) and fatigue. Negative for chills.  Respiratory:  Positive for shortness of breath (today). Negative for cough.   Cardiovascular: Negative for chest pain.  Gastrointestinal: Negative for vomiting and abdominal pain.  All other systems reviewed and are negative.     Allergies  Bee venom; Other; and Penicillins  Home Medications   Prior to Admission medications   Medication Sig Start Date End Date Taking? Authorizing Provider  azithromycin (ZITHROMAX Z-PAK) 250 MG tablet Take 2 tabs today; then begin one tab once daily for 4 more days. Patient not taking: Reported on 07/11/2014 03/07/14   Lattie Haw, MD  benzonatate (TESSALON) 200 MG capsule Take 1 capsule (200 mg total) by mouth at bedtime. Take as needed for cough Patient not taking: Reported on 07/11/2014 03/07/14   Lattie Haw, MD  clonazePAM (KLONOPIN) 1 MG tablet Take 1 mg by mouth 2 (two) times daily.    Historical Provider, MD  cyclobenzaprine (FLEXERIL) 10 MG tablet Take 10 mg by mouth 4 (four) times daily as needed for muscle spasms.    Historical Provider, MD  diazepam (VALIUM) 1 MG/ML solution Take by mouth every 8 (eight) hours as needed for anxiety.    Historical Provider, MD  ESTROGENS CONJUGATED PO Take by mouth daily.    Historical Provider, MD  gabapentin (NEURONTIN) 100 MG capsule Take 100 mg by mouth 3 (three) times daily.    Historical Provider, MD  levothyroxine (SYNTHROID, LEVOTHROID) 50 MCG tablet  Take 50 mcg by mouth daily before breakfast.    Historical Provider, MD  ROPINIROLE HCL PO Take by mouth daily.    Historical Provider, MD  sertraline (ZOLOFT) 100 MG tablet Take 100 mg by mouth 2 (two) times daily.    Historical Provider, MD  traZODone (DESYREL) 100 MG tablet Take 100 mg by mouth at bedtime.    Historical Provider, MD   BP 118/62 mmHg  Pulse 74  Temp(Src) 97.8 F (36.6 C) (Oral)  Resp 18  Ht 5\' 4"  (1.626 m)  Wt 220 lb (99.791 kg)  BMI 37.74 kg/m2  SpO2 97% Physical Exam  Constitutional: She is oriented to person, place, and time. She appears  well-developed and well-nourished. No distress.  HENT:  Head: Normocephalic and atraumatic.  Mouth/Throat: Oropharynx is clear and moist.  Eyes: EOM are normal. Pupils are equal, round, and reactive to light.  Neck: Normal range of motion. Neck supple.  Cardiovascular: Normal rate and regular rhythm.  Exam reveals no friction rub.   No murmur heard. Pulmonary/Chest: Effort normal and breath sounds normal. No respiratory distress. She has no wheezes. She has no rales.  Abdominal: Soft. She exhibits no distension. There is no tenderness. There is no rebound.  Musculoskeletal: She exhibits no edema.       Left knee: She exhibits decreased range of motion (secondary to post-surgery).       Legs: L knee is warm  Neurological: She is alert and oriented to person, place, and time. No cranial nerve deficit. She exhibits normal muscle tone. Coordination normal.  Skin: She is not diaphoretic.  Nursing note and vitals reviewed.   ED Course  Procedures (including critical care time) Labs Review Labs Reviewed  CBC - Abnormal; Notable for the following:    RBC 3.47 (*)    Hemoglobin 11.3 (*)    HCT 33.8 (*)    All other components within normal limits  BASIC METABOLIC PANEL  SEDIMENTATION RATE  TROPONIN I    Imaging Review Dg Chest 2 View  09/04/2014   CLINICAL DATA:  Cough and shortness of breath.  History of CHF.  EXAM: CHEST  2 VIEW  COMPARISON:  02/22/2014  FINDINGS: The heart size and mediastinal contours are within normal limits. Both lungs are clear. No pleural effusion or pneumothorax. The visualized skeletal structures are unremarkable.  IMPRESSION: No active cardiopulmonary disease.   Electronically Signed   By: Amie Portland M.D.   On: 09/04/2014 18:24   Dg Knee Complete 4 Views Left  09/04/2014   CLINICAL DATA:  Lt knee redness around incision x 1 week after swimming in pool. ? Wound infection s/p ACL surgery x Jul 29, 2014. HX: Septic x 1 year ago with sinus infection.  EXAM:  LEFT KNEE - COMPLETE 4+ VIEW  COMPARISON:  None.  FINDINGS: There are changes from anterior cruciate ligament reconstruction with formation of a tibial and distal femoral tunnel. There is no acute fracture. There is no bone resorption to suggest osteomyelitis. Knee joint is normally spaced and aligned. Minimal joint effusion is suggested on the lateral view. Soft tissues otherwise unremarkable. No soft tissue air.  IMPRESSION: 1. No fracture. No radiopaque foreign body other than the orthopedic hardware related to the ACL reconstruction. 2. No evidence of osteomyelitis. 3. Minimal joint effusion.   Electronically Signed   By: Amie Portland M.D.   On: 09/04/2014 18:25     EKG Interpretation   Date/Time:  Thursday September 04 2014 17:57:55 EDT Ventricular Rate:  70 PR Interval:  148 QRS Duration: 86 QT Interval:  426 QTC Calculation: 460 R Axis:   56 Text Interpretation:  Normal sinus rhythm Normal ECG No prior for  comparison Confirmed by Gwendolyn GrantWALDEN  MD, Renae Mottley ([id number]) on 09/04/2014 7:01:57 PM      MDM   Final diagnoses:  Shortness of breath  Knee pain, acute, left    49 year old female here fatigue and knee pain. We'll check labs with her fatigue to see if she is possibly infected from anything. Denies any abdominal pain, vomiting, dysuria, diarrhea. No cough. She has stated some chest tightness today without chest pain. Left knee pain: She is 1 month postop from her anterior cruciate ligament repair. The lateral surgical site is mildly red and the knee is warm but there is no large effusion and she can bend it. I doubt this is septic arthritis, but I will speak with orthopedics since she is so close to recent surgery Dr. Thomasena Edisollins recommends Keflex and f/u tmw. Patient has f/u tmw already scheduled.  Labs ok, negative BNP, doubt CHF exacerbation. Negative white count, normal sed rate. Highly doubt joint infection. Stable for discharge.   Elwin MochaBlair Alp Goldwater, MD 09/04/14 2126

## 2014-09-04 NOTE — ED Notes (Signed)
Possible wound infection left leg. She went swimming in a pool after ACL surgery. The incision site is red and tender. She feels tired. Hx of being septic in the past for sinus infection.

## 2014-09-04 NOTE — Discharge Instructions (Signed)

## 2014-09-05 NOTE — Therapy (Signed)
[facility] [address] [place], [place], [address] Phone: [phone]   Fax:  [phone]  Patient Details  Name: Lindsey Love MRN: [id number] Date of Birth: [birth year] Referring Provider:  No ref. provider found  Encounter Date: 09/04/2014  Pt arrived for PT treatment. Pt reported she did not feel well; felt like her blood pressure was off and that she could fall asleep at any moment.  She stated it felt similar to when she was septic in the past.   Pt complained of increased pain in knee with walking.  Vitals checked: spO2: 97%, HR 72 bpm, BP: 132/76 seated.  Pt's knee incision site (lateral) presented with increased redness from last visit and knee felt warm to touch.  PT treatment held. Pt taken over to Urgent Care to seek additional medical care.   Salvadore OxfordCarlson-Long, Calin Ellery L 09/05/2014, 12:53 PM  [facility] [address] [place], [place], [address] Phone: [phone]   Fax:  [phone]

## 2014-09-12 ENCOUNTER — Encounter: Payer: PRIVATE HEALTH INSURANCE | Admitting: Physical Therapy

## 2014-09-15 ENCOUNTER — Encounter: Payer: Self-pay | Admitting: Physical Therapy

## 2014-09-15 ENCOUNTER — Ambulatory Visit (INDEPENDENT_AMBULATORY_CARE_PROVIDER_SITE_OTHER): Payer: PRIVATE HEALTH INSURANCE | Admitting: Physical Therapy

## 2014-09-15 DIAGNOSIS — M7989 Other specified soft tissue disorders: Secondary | ICD-10-CM | POA: Diagnosis not present

## 2014-09-15 DIAGNOSIS — M25562 Pain in left knee: Secondary | ICD-10-CM

## 2014-09-15 DIAGNOSIS — M6281 Muscle weakness (generalized): Secondary | ICD-10-CM | POA: Diagnosis not present

## 2014-09-15 DIAGNOSIS — M25662 Stiffness of left knee, not elsewhere classified: Secondary | ICD-10-CM

## 2014-09-15 DIAGNOSIS — R262 Difficulty in walking, not elsewhere classified: Secondary | ICD-10-CM

## 2014-09-15 NOTE — Therapy (Signed)
[place], [place], [address] Phone: [phone]   Fax:  [phone]  Physical Therapy Treatment  Patient Details  Name: Lindsey Love MRN: [id number] Date of Birth: 04-23-1965 Referring Provider:  Justice Britain, MD  Encounter Date: 09/15/2014      PT End of Session - 09/15/14 1515    Visit Number 7   Number of Visits 24   Date for PT Re-Evaluation 10/30/14   PT Start Time 3875      Past Medical History  Diagnosis Date  . CHF (congestive heart failure)   . Hyperlipidemia   . Thyroid disease     Past Surgical History  Procedure Laterality Date  . Abdominal hysterectomy      There were no vitals filed for this visit.  Visit Diagnosis:  Pain in left knee  Stiffness of knee joint, left  Swelling of limb  Generalized muscle weakness  Difficulty walking      Subjective Assessment - 09/15/14 1517    Subjective Pt reports she has been moving from the 3rd floor and up/down a lot of stairs, also has been cleaning and is sore and tired now.  She treated the infection in the side of her knee by herself and now it feels a lot better.    Pain Score 7    Pain Location Knee   Pain Orientation Left   Pain Descriptors / Indicators Sharp   Pain Type Surgical pain   Pain Onset More than a month ago   Pain Frequency Constant   Aggravating Factors  standing, walking and stairs   Pain Relieving Factors ice             [facility] PT Assessment - 09/15/14 0001    Assessment   Medical Diagnosis Lt knee ACL tear; repair   Onset Date/Surgical Date 07/29/14   Next MD Visit 09/17/14   Precautions   Required Braces or Orthoses Other Brace/Splint  hinged knee brace Lt   AROM   AROM Assessment Site Knee   Right/Left Knee Left   Left Knee Extension 0   Left Knee Flexion 111   Left Ankle Dorsiflexion 8   Left Ankle Plantar Flexion --  WNL   Strength   Strength Assessment Site Knee   Right/Left  Hip Left   Left Hip Flexion 4+/5   Left Hip Extension 4+/5   Left Hip ABduction 4+/5   Left Hip ADduction 4+/5   Right/Left Knee Left   Left Knee Flexion 5/5   Left Knee Extension 4+/5  fair (-) eccentric quad control   Balance   Balance Assessed --  single leg stance Lt:                      [facility] Adult PT Treatment/Exercise - 09/15/14 0001    Ambulation/Gait   Ambulation/Gait --  worked on heel toe pattern on the Lt,   Gait Pattern Left circumduction;Decreased hip/knee flexion - left   Posture/Postural Control   Posture Comments increased edema in Lt knee and calf, (-) Holmans sign.    Knee/Hip Exercises: Aerobic   Stationary Bike Full revolutions on bike for ROM x 7 min    Knee/Hip Exercises: Supine   Bridges Strengthening;Both;3 sets;10 reps   Straight Leg Raise with External Rotation Strengthening;Left;2 sets;10 reps   Knee/Hip Exercises: Prone   Hamstring Curl 3 sets;10 reps   Modalities   Modalities Vasopneumatic;Electrical Stimulation   Acupuncturist  Stimulation Location lt knee   Electrical Stimulation Action ion repelling   Electrical Stimulation Parameters to tolerance   Electrical Stimulation Goals Edema;Pain   Vasopneumatic   Number Minutes Vasopneumatic  15 minutes   Vasopnuematic Location  Knee  Lt   Vasopneumatic Pressure Medium   Vasopneumatic Temperature  3*                  PT Short Term Goals - 09/15/14 1520    PT SHORT TERM GOAL #1   Title I with initial HEP   Status Achieved   PT SHORT TERM GOAL #2   Title able to perform a solid quad contraction and SLR on L with no lag   Status Achieved   PT SHORT TERM GOAL #3   Title Improved Left knee flexion to 100 degrees actively   Status Achieved           PT Long Term Goals - 09/15/14 1520    PT LONG TERM GOAL #1   Title I with advanced HEP   Status On-going   PT LONG TERM GOAL #2   Title L knee flexion within 10 degrees of Rt   Status  On-going   PT LONG TERM GOAL #3   Title demo 5/5 LLE strength   Status On-going   PT LONG TERM GOAL #4   Title climb stairs with a reciprocal gait pattern on stairs without immobilizer   Status On-going   PT LONG TERM GOAL #5   Title amb with a normal gait pattern without AD    Status On-going   PT LONG TERM GOAL #6   Title decreased pain by 50% with sitting and standing   Status Achieved   PT LONG TERM GOAL #7   Title improved FOTO to 50% limitation  67% limited, was 82% at initial eval               Plan - 09/15/14 1541    Clinical Impression Statement Pt continues to have improving Lt knee flexion, her strength is also improving.  She has met her STGs and progressing to the LTGs.  Pt reports doing a lot at home and is frequently fatigued for therapy. She has difficulty performing SLS Lt proprioception exercise.    Pt will benefit from skilled therapeutic intervention in order to improve on the following deficits Abnormal gait;Decreased range of motion;Difficulty walking;Decreased activity tolerance;Pain;Decreased strength;Decreased mobility   Rehab Potential Good   PT Frequency 2x / week   PT Duration 12 weeks   PT Treatment/Interventions ADLs/Self Care Home Management;Moist Heat;Patient/family education;DME Instruction;Therapeutic exercise;Passive range of motion;Ultrasound;Gait training;Manual techniques;Cryotherapy;Stair training;Neuromuscular re-education;Electrical Stimulation   PT Next Visit Plan cont ROM, strength and proprioception   Consulted and Agree with Plan of Care Patient        Problem List There are no active problems to display for this patient.   Jeral Pinch, PT 09/15/2014, 3:55 PM  [facility] [place], [place], [address] Phone: [phone]   Fax:  [phone]

## 2014-09-18 ENCOUNTER — Encounter: Payer: Self-pay | Admitting: Physical Therapy

## 2014-09-18 ENCOUNTER — Ambulatory Visit (INDEPENDENT_AMBULATORY_CARE_PROVIDER_SITE_OTHER): Payer: PRIVATE HEALTH INSURANCE | Admitting: Physical Therapy

## 2014-09-18 DIAGNOSIS — M6281 Muscle weakness (generalized): Secondary | ICD-10-CM

## 2014-09-18 DIAGNOSIS — M25562 Pain in left knee: Secondary | ICD-10-CM

## 2014-09-18 DIAGNOSIS — M7989 Other specified soft tissue disorders: Secondary | ICD-10-CM | POA: Diagnosis not present

## 2014-09-18 DIAGNOSIS — M25662 Stiffness of left knee, not elsewhere classified: Secondary | ICD-10-CM

## 2014-09-18 NOTE — Therapy (Signed)
[facility] [address] [place], [place], [address] Phone: [phone]   Fax:  [phone]  Physical Therapy Treatment  Patient Details  Name: Lindsey BoyersVelvet Buccellato MRN: [id number] Date of Birth: Dec 28, 1965 Referring Provider:  Francena HanlySupple, Kevin, MD  Encounter Date: 09/18/2014      PT End of Session - 09/18/14 1544    Visit Number 8   Number of Visits 24   Date for PT Re-Evaluation 10/30/14   PT Start Time 1540   PT Stop Time 1631   PT Time Calculation (min) 51 min      Past Medical History  Diagnosis Date  . CHF (congestive heart failure)   . Hyperlipidemia   . Thyroid disease     Past Surgical History  Procedure Laterality Date  . Abdominal hysterectomy      There were no vitals filed for this visit.  Visit Diagnosis:  Pain in left knee  Stiffness of knee joint, left  Swelling of limb  Generalized muscle weakness      Subjective Assessment - 09/18/14 1543    Subjective Pt reports that she is still very tired and sore. Her sleep is becoming more difficult now due to pain. Has small opening in the lateral incision, she has it covered with a bandaide   Currently in Pain? Yes  up to 10/10 at bed time.    Pain Score 6                          [facility] Adult PT Treatment/Exercise - 09/18/14 0001    Knee/Hip Exercises: Stretches   Active Hamstring Stretch Left;2 reps;30 seconds  supine with strap   Quad Stretch Left;2 reps;30 seconds  prone with strap   ITB Stretch Left;30 seconds  supine with strap, cross body   Gastroc Stretch 2 reps;30 seconds;Both   Knee/Hip Exercises: Aerobic   Stationary Bike Nu step L4 x 5'   Knee/Hip Exercises: Standing   Rebounder SLS Lt with head turns to side and up/down  the gentle bouncing bilat LE's    Walking with Sports Cord FWD/BWD walking with pink band  limited going BWD d/t gastroc calf    Modalities   Modalities Vasopneumatic;Electrical Stimulation   Electrical Stimulation   Electrical Stimulation Location lt knee   Electrical Stimulation Action ion repelling   Electrical Stimulation Parameters to tolerance   Electrical Stimulation Goals Edema;Pain   Vasopneumatic   Number Minutes Vasopneumatic  15 minutes   Vasopnuematic Location  Ankle;Knee   Vasopneumatic Pressure Medium   Vasopneumatic Temperature  3*                  PT Short Term Goals - 09/15/14 1520    PT SHORT TERM GOAL #1   Title I with initial HEP   Status Achieved   PT SHORT TERM GOAL #2   Title able to perform a solid quad contraction and SLR on L with no lag   Status Achieved   PT SHORT TERM GOAL #3   Title Improved Left knee flexion to 100 degrees actively   Status Achieved           PT Long Term Goals - 09/15/14 1520    PT LONG TERM GOAL #1   Title I with advanced HEP   Status On-going   PT LONG TERM GOAL #2   Title L knee flexion within 10 degrees of Rt   Status On-going   PT  LONG TERM GOAL #3   Title demo 5/5 LLE strength   Status On-going   PT LONG TERM GOAL #4   Title climb stairs with a reciprocal gait pattern on stairs without immobilizer   Status On-going   PT LONG TERM GOAL #5   Title amb with a normal gait pattern without AD    Status On-going   PT LONG TERM GOAL #6   Title decreased pain by 50% with sitting and standing   Status Achieved   PT LONG TERM GOAL #7   Title improved FOTO to 50% limitation  67% limited, was 82% at initial eval               Plan - 09/18/14 1604    Clinical Impression Statement Pt is still very fatigued and with reports of significant Lt knee pain.  Her swelling is down into the foot this afternoon.     Pt will benefit from skilled therapeutic intervention in order to improve on the following deficits Abnormal gait;Decreased range of motion;Difficulty walking;Decreased activity tolerance;Pain;Decreased strength;Decreased mobility   Rehab Potential Good   PT Frequency 1x / week   PT  Duration 12 weeks   PT Treatment/Interventions ADLs/Self Care Home Management;Moist Heat;Patient/family education;DME Instruction;Therapeutic exercise;Passive range of motion;Ultrasound;Gait training;Manual techniques;Cryotherapy;Stair training;Neuromuscular re-education;Electrical Stimulation   PT Next Visit Plan assess how vaso to the foot and knee worked.    Consulted and Agree with Plan of Care Patient        Problem List There are no active problems to display for this patient.   Roderic Scarce PT 09/18/2014, 4:18 PM  [facility] [address] [place], [place], [address] Phone: [phone]   Fax:  [phone]

## 2014-09-22 ENCOUNTER — Ambulatory Visit (HOSPITAL_COMMUNITY): Payer: PRIVATE HEALTH INSURANCE | Attending: Orthopedic Surgery

## 2014-09-22 ENCOUNTER — Other Ambulatory Visit: Payer: Self-pay | Admitting: Orthopedic Surgery

## 2014-09-22 DIAGNOSIS — M7989 Other specified soft tissue disorders: Secondary | ICD-10-CM

## 2014-09-22 DIAGNOSIS — M79605 Pain in left leg: Secondary | ICD-10-CM | POA: Diagnosis not present

## 2014-09-24 ENCOUNTER — Ambulatory Visit (INDEPENDENT_AMBULATORY_CARE_PROVIDER_SITE_OTHER): Payer: PRIVATE HEALTH INSURANCE | Admitting: Physical Therapy

## 2014-09-24 ENCOUNTER — Encounter: Payer: Self-pay | Admitting: Physical Therapy

## 2014-09-24 DIAGNOSIS — M25562 Pain in left knee: Secondary | ICD-10-CM | POA: Diagnosis not present

## 2014-09-24 DIAGNOSIS — M7989 Other specified soft tissue disorders: Secondary | ICD-10-CM | POA: Diagnosis not present

## 2014-09-24 DIAGNOSIS — M6281 Muscle weakness (generalized): Secondary | ICD-10-CM | POA: Diagnosis not present

## 2014-09-24 DIAGNOSIS — R262 Difficulty in walking, not elsewhere classified: Secondary | ICD-10-CM

## 2014-09-24 DIAGNOSIS — M25662 Stiffness of left knee, not elsewhere classified: Secondary | ICD-10-CM | POA: Diagnosis not present

## 2014-09-24 NOTE — Therapy (Signed)
[facility] [address] [place], [place], [address] Phone: [phone]   Fax:  [phone]  Physical Therapy Treatment  Patient Details  Name: Francene BoyersVelvet Love MRN: [id number] Date of Birth: April 20, 1965 Referring Provider:  Francena HanlySupple, Kevin, MD  Encounter Date: 09/24/2014      PT End of Session - 09/24/14 1709    Visit Number 9   Number of Visits 24   Date for PT Re-Evaluation 10/30/14   PT Start Time 0315   PT Stop Time 0408   PT Time Calculation (min) 53 min   Activity Tolerance Patient tolerated treatment well      Past Medical History  Diagnosis Date  . CHF (congestive heart failure)   . Hyperlipidemia   . Thyroid disease     Past Surgical History  Procedure Laterality Date  . Abdominal hysterectomy      There were no vitals filed for this visit.  Visit Diagnosis:  Pain in left knee  Stiffness of knee joint, left  Swelling of limb  Generalized muscle weakness  Difficulty walking      Subjective Assessment - 09/24/14 1533    Subjective Seen by MD yesterday. Dr Rennis ChrisSupple wants patient to come to his clinic for therapy. [facility] b/c it is closer. MD added Naproxin for swelling which has helped with swelling already. She woke up this am with less pain and swelling. Whole body feels better.    Currently in Pain? No/denies          [employer] PT Assessment - 09/24/14 0001    Assessment   Medical Diagnosis Lt knee ACL tear; repair   Onset Date/Surgical Date 07/29/14   AROM   AROM Assessment Site Knee   Right/Left Knee Left   Left Knee Extension 0   Left Knee Flexion 125           [facility] Adult PT Treatment/Exercise - 09/24/14 0001    Knee/Hip Exercises: Stretches   Active Hamstring Stretch Left;2 reps;30 seconds  supine with strap   Quad Stretch Left;2 reps;30 seconds  prone with strap   ITB Stretch Left;30 seconds  supine with strap, cross body   Gastroc Stretch 2 reps;30 seconds;Both   Knee/Hip Exercises: Aerobic   Stationary Bike bike L1 6 min   Knee/Hip Exercises: Standing   Wall Squat 2 sets;10 reps;5 seconds   SLS 8 trials 10-15 sec blue TB foam   Rebounder SLS Lt with head turns to side and up/down  the gentle bouncing bilat LE's    Walking with Sports Cord FWD/BWD walking with pink band  limited going BWD d/t gastroc calf    Knee/Hip Exercises: Supine   Terminal Knee Extension Strengthening;Left;2 sets;10 reps   Theraband Level (Terminal Knee Extension) Level 3 (Green)   Bridges Strengthening;2 sets;10 reps  weught shifted to LT closed chain   Modalities   Modalities Vasopneumatic;Electrical Stimulation   Conservation officer, historic buildingslectrical Stimulation   Electrical Stimulation Location lt knee   Electrical Stimulation Action ion repelliing   Electrical Stimulation Parameters to tolerance   Electrical Stimulation Goals Edema   Vasopneumatic   Number Minutes Vasopneumatic  15 minutes   Vasopnuematic Location  Knee   Vasopneumatic Pressure Medium   Vasopneumatic Temperature  3*            PT Education - 09/24/14 1708    Education provided Yes   Education Details HEP encouraged edema control   Person(s) Educated Patient   Methods  Explanation   Comprehension Verbalized understanding          PT Short Term Goals - 09/15/14 1520    PT SHORT TERM GOAL #1   Title I with initial HEP   Status Achieved   PT SHORT TERM GOAL #2   Title able to perform a solid quad contraction and SLR on L with no lag   Status Achieved   PT SHORT TERM GOAL #3   Title Improved Left knee flexion to 100 degrees actively   Status Achieved           PT Long Term Goals - 09/15/14 1520    PT LONG TERM GOAL #1   Title I with advanced HEP   Status On-going   PT LONG TERM GOAL #2   Title L knee flexion within 10 degrees of Rt   Status On-going   PT LONG TERM GOAL #3   Title demo 5/5 LLE strength   Status On-going   PT LONG TERM GOAL #4   Title  climb stairs with a reciprocal gait pattern on stairs without immobilizer   Status On-going   PT LONG TERM GOAL #5   Title amb with a normal gait pattern without AD    Status On-going   PT LONG TERM GOAL #6   Title decreased pain by 50% with sitting and standing   Status Achieved   PT LONG TERM GOAL #7   Title improved FOTO to 50% limitation  67% limited, was 82% at initial eval               Plan - 09/24/14 1710    Clinical Impression Statement Edema is improving and she feels that her knee is moving better. ROM is increasing. Exercises are progressing well.   Pt will benefit from skilled therapeutic intervention in order to improve on the following deficits Abnormal gait;Decreased range of motion;Difficulty walking;Decreased activity tolerance;Pain;Decreased strength;Decreased mobility   Rehab Potential Good   PT Frequency 1x / week   PT Duration 12 weeks   PT Treatment/Interventions ADLs/Self Care Home Management;Moist Heat;Patient/family education;DME Instruction;Therapeutic exercise;Passive range of motion;Ultrasound;Gait training;Manual techniques;Cryotherapy;Stair training;Neuromuscular re-education;Electrical Stimulation   PT Next Visit Plan progress with closed chain exercises   PT Home Exercise Plan Added prone knee flexion; standing activities   Consulted and Agree with Plan of Care Patient        Problem List There are no active problems to display for this patient.   Val Riles, PT, MPH 09/24/2014, 5:14 PM  [facility] [address] [place], [place], [address] Phone: [phone]   Fax:  [phone]

## 2014-09-26 ENCOUNTER — Ambulatory Visit (INDEPENDENT_AMBULATORY_CARE_PROVIDER_SITE_OTHER): Payer: PRIVATE HEALTH INSURANCE | Admitting: Rehabilitative and Restorative Service Providers"

## 2014-09-26 ENCOUNTER — Encounter: Payer: Self-pay | Admitting: Rehabilitative and Restorative Service Providers"

## 2014-09-26 DIAGNOSIS — M25562 Pain in left knee: Secondary | ICD-10-CM

## 2014-09-26 DIAGNOSIS — M25662 Stiffness of left knee, not elsewhere classified: Secondary | ICD-10-CM | POA: Diagnosis not present

## 2014-09-26 DIAGNOSIS — M6281 Muscle weakness (generalized): Secondary | ICD-10-CM

## 2014-09-26 DIAGNOSIS — M7989 Other specified soft tissue disorders: Secondary | ICD-10-CM | POA: Diagnosis not present

## 2014-09-26 DIAGNOSIS — R262 Difficulty in walking, not elsewhere classified: Secondary | ICD-10-CM

## 2014-09-26 NOTE — Patient Instructions (Signed)
Anterior Lateral Step-Down   Stand with both feet on _2-3__ inch step.  Standing on step with left leg,  Touching right heel to the floor and return _10__ times. _2-3__ sets _1_ times per day.   Anterior Medial Step-Down  Forward off step Stand with both feet on _2-3__ inch step. Step down with right foot, touching right heel to the floor in front and return _10__ times. _2-3__ sets _1__ times per day.

## 2014-09-26 NOTE — Therapy (Signed)
[facility] [place] [address] [place], [place], [address] Phone: [phone]   Fax:  [phone]  Physical Therapy Treatment  Patient Details  Name: Lindsey Love MRN: [id number] Date of Birth: [birth year] Referring Provider:  Francena Hanly, MD  Encounter Date: 09/26/2014      PT End of Session - 09/26/14 1550    Visit Number 10   Number of Visits 24   Date for PT Re-Evaluation 10/30/14   PT Start Time 0250   PT Stop Time 0355   PT Time Calculation (min) 65 min   Activity Tolerance Patient tolerated treatment well      Past Medical History  Diagnosis Date  . CHF (congestive heart failure)   . Hyperlipidemia   . Thyroid disease     Past Surgical History  Procedure Laterality Date  . Abdominal hysterectomy      There were no vitals filed for this visit.  Visit Diagnosis:  Pain in left knee  Stiffness of knee joint, left  Swelling of limb  Generalized muscle weakness  Difficulty walking      Subjective Assessment - 09/26/14 1507    Subjective A Small suture came out of incision in knee. Checked it in urgent care. No bleeding no problems - knee actually feels better.   Currently in Pain? No/denies           [facility] Adult PT Treatment/Exercise - 09/26/14 0001    Ambulation/Gait   Gait Comments working on heel toe gait pattern   Knee/Hip Exercises: Stretches   Active Hamstring Stretch Left;2 reps;30 seconds  supine with strap   Quad Stretch Left;2 reps;30 seconds  prone with strap   ITB Stretch Left;30 seconds  supine with strap, cross body   Gastroc Stretch 2 reps;30 seconds;Both   Knee/Hip Exercises: Aerobic   Stationary Bike Nu-step L5 10 min   Knee/Hip Exercises: Standing   Lateral Step Up 10 reps;2 sets;Left   Lateral Step Up Limitations heel touch   Step Down 10 reps;2 sets   Step Down Limitations heel touch   Wall Squat 15 reps;5 seconds   SLS 8 trials 10-15 sec blue TB foam   Rebounder  SLS Lt with head turns to side and up/down  the gentle bouncing bilat LE's    Knee/Hip Exercises: Supine   Terminal Knee Extension Strengthening;Left;2 sets;10 reps   Theraband Level (Terminal Knee Extension) Level 3 (Green)   Bridges Strengthening;2 sets;10 reps  weught shifted to LT closed chain   Modalities   Modalities Vasopneumatic;Electrical International aid/development worker IFC   Electrical Stimulation Parameters to tolerance   Electrical Stimulation Goals Edema   Vasopneumatic   Number Minutes Vasopneumatic  15 minutes   Vasopnuematic Location  Knee   Vasopneumatic Pressure Medium   Vasopneumatic Temperature  3*            PT Education - 09/26/14 1545    Education provided Yes   Education Details added heel touch lateral and forward on 3 inch step    Person(s) Educated Patient   Methods Explanation;Demonstration;Verbal cues;Handout   Comprehension Verbalized understanding;Returned demonstration;Verbal cues required          PT Short Term Goals - 09/15/14 1520    PT SHORT TERM GOAL #1   Title I with initial HEP   Status Achieved   PT SHORT TERM GOAL #2   Title able to perform a solid quad  contraction and SLR on L with no lag   Status Achieved   PT SHORT TERM GOAL #3   Title Improved Left knee flexion to 100 degrees actively   Status Achieved           PT Long Term Goals - 09/15/14 1520    PT LONG TERM GOAL #1   Title I with advanced HEP   Status On-going   PT LONG TERM GOAL #2   Title L knee flexion within 10 degrees of Rt   Status On-going   PT LONG TERM GOAL #3   Title demo 5/5 LLE strength   Status On-going   PT LONG TERM GOAL #4   Title climb stairs with a reciprocal gait pattern on stairs without immobilizer   Status On-going   PT LONG TERM GOAL #5   Title amb with a normal gait pattern without AD    Status On-going   PT LONG TERM GOAL #6   Title decreased  pain by 50% with sitting and standing   Status Achieved   PT LONG TERM GOAL #7   Title improved FOTO to 50% limitation  67% limited, was 82% at initial eval           Plan - 09/26/14 1551    Clinical Impression Statement Progressing well with strengthening working on closed chan activities; improved edema   Pt will benefit from skilled therapeutic intervention in order to improve on the following deficits Abnormal gait;Decreased range of motion;Difficulty walking;Decreased activity tolerance;Pain;Decreased strength;Decreased mobility   Rehab Potential Good   PT Frequency 2x / week   PT Duration 12 weeks   PT Treatment/Interventions ADLs/Self Care Home Management;Moist Heat;Patient/family education;DME Instruction;Therapeutic exercise;Passive range of motion;Ultrasound;Gait training;Manual techniques;Cryotherapy;Stair training;Neuromuscular re-education;Electrical Stimulation   PT Next Visit Plan progress with closed chain exercises   PT Home Exercise Plan lateral and forward heel touches   Consulted and Agree with Plan of Care Patient        Problem List There are no active problems to display for this patient.   Val Riles, PT, MPH 09/26/2014, 3:54 PM  [facility] [address] [place], [place], [address] Phone: [phone]   Fax:  [phone]

## 2014-10-01 ENCOUNTER — Encounter: Payer: PRIVATE HEALTH INSURANCE | Admitting: Rehabilitative and Restorative Service Providers"

## 2014-10-03 ENCOUNTER — Encounter: Payer: Self-pay | Admitting: Rehabilitative and Restorative Service Providers"

## 2014-10-03 ENCOUNTER — Ambulatory Visit (INDEPENDENT_AMBULATORY_CARE_PROVIDER_SITE_OTHER): Payer: PRIVATE HEALTH INSURANCE | Admitting: Rehabilitative and Restorative Service Providers"

## 2014-10-03 DIAGNOSIS — M6281 Muscle weakness (generalized): Secondary | ICD-10-CM

## 2014-10-03 DIAGNOSIS — M7989 Other specified soft tissue disorders: Secondary | ICD-10-CM | POA: Diagnosis not present

## 2014-10-03 DIAGNOSIS — M25662 Stiffness of left knee, not elsewhere classified: Secondary | ICD-10-CM | POA: Diagnosis not present

## 2014-10-03 DIAGNOSIS — M25562 Pain in left knee: Secondary | ICD-10-CM | POA: Diagnosis not present

## 2014-10-03 DIAGNOSIS — R262 Difficulty in walking, not elsewhere classified: Secondary | ICD-10-CM

## 2014-10-03 NOTE — Therapy (Signed)
[facility] [address] [place], [place], [address] Phone: [phone]   Fax:  [phone]  Physical Therapy Treatment  Patient Details  Name: Lindsey Love MRN: [id number] Date of Birth: [birth year] Referring Provider:  Roland Rack, MD  Encounter Date: 10/03/2014      PT End of Session - 10/03/14 1134    Visit Number 11   Number of Visits 24   Date for PT Re-Evaluation 10/30/14   PT Start Time 0940   PT Stop Time 1033   PT Time Calculation (min) 53 min   Activity Tolerance Patient tolerated treatment well      Past Medical History  Diagnosis Date  . CHF (congestive heart failure)   . Hyperlipidemia   . Thyroid disease     Past Surgical History  Procedure Laterality Date  . Abdominal hysterectomy      There were no vitals filed for this visit.  Visit Diagnosis:  Pain in left knee  Stiffness of knee joint, left  Swelling of limb  Generalized muscle weakness  Difficulty walking      Subjective Assessment - 10/03/14 0944    Subjective Knee is sore at the knee cap. She is working on her HEP and has been walking ~500+ steps each evening.    Currently in Pain? No/denies           [facility] Adult PT Treatment/Exercise - 10/03/14 0001    Knee/Hip Exercises: Stretches   Lobbyist Left;2 reps;30 seconds  prone with strap   ITB Stretch Left;30 seconds  supine with strap, cross body   Gastroc Stretch 2 reps;30 seconds;Both   Knee/Hip Exercises: Aerobic   Stationary Bike Bike L4 6 min   Knee/Hip Exercises: Standing   Lateral Step Up 15 reps;2 sets   Lateral Step Up Limitations heel touch   Step Down 15 reps;2 sets   Step Down Limitations heel touch   Wall Squat 15 reps;5 seconds   SLS 8 trials 10-15 sec gray TB foam   Rebounder SLS Lt with head turns to side and up/down  the gentle bouncing bilat LE's    Walking with Sports Cord FWD/BWD walking with pink band   Other Standing Knee Exercises  partial sit to stand lowering and lifting in partial sit    Knee/Hip Exercises: Supine   Bridges Strengthening;2 sets;10 reps  weught shifted to LT closed chain   Modalities   Modalities Vasopneumatic;Electrical Stimulation   Electrical Stimulation   Electrical Stimulation Location lt knee   Electrical Stimulation Action IFC   Electrical Stimulation Parameters to tolerance   Electrical Stimulation Goals Edema;Pain   Vasopneumatic   Number Minutes Vasopneumatic  15 minutes   Vasopneumatic Pressure Medium   Vasopneumatic Temperature  3*            PT Education - 10/03/14 1134    Education provided Yes   Education Details will add eccentric lowering stand to sit    Person(s) Educated Patient   Methods Explanation;Demonstration;Verbal cues   Comprehension Verbalized understanding;Returned demonstration;Verbal cues required          PT Short Term Goals - 09/15/14 1520    PT SHORT TERM GOAL #1   Title I with initial HEP   Status Achieved   PT SHORT TERM GOAL #2   Title able to perform a solid quad contraction and SLR on L with no lag   Status Achieved   PT SHORT TERM GOAL #3   Title Improved Left knee  flexion to 100 degrees actively   Status Achieved           PT Long Term Goals - 09/15/14 1520    PT LONG TERM GOAL #1   Title I with advanced HEP   Status On-going   PT LONG TERM GOAL #2   Title L knee flexion within 10 degrees of Rt   Status On-going   PT LONG TERM GOAL #3   Title demo 5/5 LLE strength   Status On-going   PT LONG TERM GOAL #4   Title climb stairs with a reciprocal gait pattern on stairs without immobilizer   Status On-going   PT LONG TERM GOAL #5   Title amb with a normal gait pattern without AD    Status On-going   PT LONG TERM GOAL #6   Title decreased pain by 50% with sitting and standing   Status Achieved   PT LONG TERM GOAL #7   Title improved FOTO to 50% limitation  67% limited, was 82% at initial eval               Plan  - 10/03/14 1135    Clinical Impression Statement Remains tender at distal lateral patellar area. Continues to progress with strengthening exercises including closed chain exercises and activities.    Pt will benefit from skilled therapeutic intervention in order to improve on the following deficits Abnormal gait;Decreased range of motion;Difficulty walking;Decreased activity tolerance;Pain;Decreased strength;Decreased mobility   Rehab Potential Good   PT Frequency 2x / week   PT Duration 12 weeks   PT Treatment/Interventions ADLs/Self Care Home Management;Moist Heat;Patient/family education;DME Instruction;Therapeutic exercise;Passive range of motion;Ultrasound;Gait training;Manual techniques;Cryotherapy;Stair training;Neuromuscular re-education;Electrical Stimulation   PT Next Visit Plan progress with closed chain exercises   PT Home Exercise Plan eccentric lowering/holding stand to sit   Consulted and Agree with Plan of Care Patient        Problem List There are no active problems to display for this patient.   Val Riles, PT, MPH 10/03/2014, 11:39 AM  [facility] [address] [place], [place], [address] Phone: [phone]   Fax:  [phone]

## 2014-10-06 ENCOUNTER — Encounter: Payer: PRIVATE HEALTH INSURANCE | Admitting: Physical Therapy

## 2014-10-08 ENCOUNTER — Encounter: Payer: PRIVATE HEALTH INSURANCE | Admitting: Physical Therapy

## 2014-10-09 ENCOUNTER — Ambulatory Visit (INDEPENDENT_AMBULATORY_CARE_PROVIDER_SITE_OTHER): Payer: PRIVATE HEALTH INSURANCE | Admitting: Rehabilitative and Restorative Service Providers"

## 2014-10-09 ENCOUNTER — Encounter: Payer: Self-pay | Admitting: Rehabilitative and Restorative Service Providers"

## 2014-10-09 DIAGNOSIS — M25562 Pain in left knee: Secondary | ICD-10-CM

## 2014-10-09 DIAGNOSIS — M6281 Muscle weakness (generalized): Secondary | ICD-10-CM

## 2014-10-09 DIAGNOSIS — M7989 Other specified soft tissue disorders: Secondary | ICD-10-CM | POA: Diagnosis not present

## 2014-10-09 DIAGNOSIS — M25662 Stiffness of left knee, not elsewhere classified: Secondary | ICD-10-CM

## 2014-10-09 DIAGNOSIS — R262 Difficulty in walking, not elsewhere classified: Secondary | ICD-10-CM

## 2014-10-09 NOTE — Therapy (Signed)
[place], [place], [address] Phone: [phone]   Fax:  [phone]  Physical Therapy Treatment  Patient Details  Name: Lindsey Love MRN: [id number] Date of Birth: September 20, 1965 Referring Christine Morton:  Justice Britain, MD  Encounter Date: 10/09/2014      PT End of Session - 10/09/14 1009    Visit Number 12   Number of Visits 24   Date for PT Re-Evaluation 10/30/14   PT Start Time 0907   PT Stop Time 1014   PT Time Calculation (min) 67 min   Activity Tolerance Patient tolerated treatment well      Past Medical History  Diagnosis Date  . CHF (congestive heart failure)   . Hyperlipidemia   . Thyroid disease     Past Surgical History  Procedure Laterality Date  . Abdominal hysterectomy      There were no vitals filed for this visit.  Visit Diagnosis:  Pain in left knee  Stiffness of knee joint, left  Swelling of limb  Generalized muscle weakness  Difficulty walking      Subjective Assessment - 10/09/14 0909    Subjective Knee is sore - tripped over a loose rug Monday - 10/06/14 and felt sharp pain just below the knee in the front.    Currently in Pain? Yes   Pain Score 2    Pain Location Knee   Pain Orientation Left   Pain Descriptors / Indicators Burning   Pain Type Acute pain   Pain Onset More than a month ago   Pain Frequency Intermittent   Aggravating Factors  when she lies down at night to relax   Pain Relieving Factors ice/OTC meds            [facility] PT Assessment - 10/09/14 0001    AROM   AROM Assessment Site Knee   Right/Left Knee Left   Left Knee Extension 0   Left Knee Flexion 125   Strength   Right/Left Hip Left   Left Hip Flexion --  5-/5   Left Hip Extension 5/5   Left Hip ABduction --  5-/5   Left Hip ADduction 5/5   Left Knee Flexion 5/5   Left Knee Extension --  5-/5   Palpation   Palpation comment tender  patellar tendon area                      [facility] Adult PT Treatment/Exercise - 10/09/14 0001    Knee/Hip Exercises: Stretches   Passive Hamstring Stretch Left;3 reps;30 seconds   Quad Stretch Left;3 reps;30 seconds   ITB Stretch Left;30 seconds  supine with strap, cross body   Gastroc Stretch Both;3 reps;30 seconds   Knee/Hip Exercises: Aerobic   Stationary Bike Bike L3 7 min    Elliptical L2 - 2 min forward/ L1 2 min back   Knee/Hip Exercises: Standing   Lateral Step Up Left;2 sets;15 reps   Lateral Step Up Limitations heel touch   Step Down 2 sets;15 reps   Step Down Limitations heel touch   Wall Squat 15 reps;5 seconds   SLS 8-10 trials 20-30 sec each blue foam   Rebounder SLS blue foam rebounding red ball 2 min 2 sets   Walking with Sports Cord FWD/BWD walking with pink band   Other Standing Knee Exercises partial sit to stand lowering and lifting in partial sit    Knee/Hip Exercises: Supine   Quad Sets Strengthening;Left;2 sets;10 reps  Quad Sets Limitations 2#   Bridges Strengthening;Both;2 sets;10 reps   Bridges Limitations adductor squeeze w/ ball   Knee/Hip Exercises: Prone   Hamstring Curl 2 sets;10 reps;2 seconds   Hamstring Curl Limitations 3#   Hip Extension Strengthening;Left;2 sets;10 reps   Hip Extension Limitations 2#   Modalities   Modalities Vasopneumatic;Electrical Stimulation   Acupuncturist Location lt knee   Electrical Stimulation Action IFC   Electrical Stimulation Parameters to tolerance   Electrical Stimulation Goals Edema;Pain   Vasopneumatic   Number Minutes Vasopneumatic  15 minutes   Vasopnuematic Location  Knee   Vasopneumatic Pressure Medium   Vasopneumatic Temperature  3*                PT Education - 10/09/14 1008    Education provided Yes   Education Details Encouraged stretching and ice to resolve irritatiion/soreness/paiin from trip this week   Person(s) Educated Patient   Methods Explanation    Comprehension Verbalized understanding          PT Short Term Goals - 09/15/14 1520    PT SHORT TERM GOAL #1   Title I with initial HEP   Status Achieved   PT SHORT TERM GOAL #2   Title able to perform a solid quad contraction and SLR on L with no lag   Status Achieved   PT SHORT TERM GOAL #3   Title Improved Left knee flexion to 100 degrees actively   Status Achieved           PT Long Term Goals - 10/09/14 1013    PT LONG TERM GOAL #1   Title I with advanced HEP   Time 8   Period Weeks   Status On-going   PT LONG TERM GOAL #2   Title L knee flexion within 10 degrees of Rt   Time 8   Period Weeks   Status Partially Met  full extension    PT LONG TERM GOAL #3   Title demo 5/5 LLE strength   Time 8   Period Weeks   Status Partially Met  5/5 hip extension and adduction   PT LONG TERM GOAL #4   Title climb stairs with a reciprocal gait pattern on stairs without immobilizer   Time 10   Period Weeks   Status Partially Met   PT LONG TERM GOAL #5   Title amb with a normal gait pattern without AD    Time 8   Period Weeks   Status Partially Met   PT LONG TERM GOAL #6   Title decreased pain by 50% with sitting and standing   Time 8   Period Weeks   Status Achieved   PT LONG TERM GOAL #7   Title improved FOTO to 50% limitation   Time 10   Period Weeks   Status On-going               Plan - 10/09/14 1009    Clinical Impression Statement [name] continues to gain strength and reports improved ADL's and functional activity level. Some increased pain in the patellar tendon area likely related to trip and near fall this week.  Continue therapy working to increse knee flexion and strength Lt LE.   Pt will benefit from skilled therapeutic intervention in order to improve on the following deficits Abnormal gait;Decreased range of motion;Difficulty walking;Decreased activity tolerance;Pain;Decreased strength;Decreased mobility   Rehab Potential Good   PT Frequency  2x / week  PT Duration 12 weeks   PT Treatment/Interventions ADLs/Self Care Home Management;Moist Heat;Patient/family education;DME Instruction;Therapeutic exercise;Passive range of motion;Ultrasound;Gait training;Manual techniques;Cryotherapy;Stair training;Neuromuscular re-education;Electrical Stimulation   PT Next Visit Plan progress with closed chain exercises per protocol   Consulted and Agree with Plan of Care Patient        Problem List There are no active problems to display for this patient.   Everardo All, PT, MPH 10/09/2014, 10:16 AM  [facility] [place], [place], [address] Phone: [phone]   Fax:  [phone]

## 2014-10-16 ENCOUNTER — Ambulatory Visit (INDEPENDENT_AMBULATORY_CARE_PROVIDER_SITE_OTHER): Payer: PRIVATE HEALTH INSURANCE | Admitting: Physical Therapy

## 2014-10-16 DIAGNOSIS — M6281 Muscle weakness (generalized): Secondary | ICD-10-CM | POA: Diagnosis not present

## 2014-10-16 DIAGNOSIS — M7989 Other specified soft tissue disorders: Secondary | ICD-10-CM | POA: Diagnosis not present

## 2014-10-16 DIAGNOSIS — M25662 Stiffness of left knee, not elsewhere classified: Secondary | ICD-10-CM

## 2014-10-16 DIAGNOSIS — M25562 Pain in left knee: Secondary | ICD-10-CM

## 2014-10-16 NOTE — Therapy (Signed)
[place], [place], [address] Phone: [phone]   Fax:  [phone]  Physical Therapy Treatment  Patient Details  Name: Lindsey Love MRN: [id number] Date of Birth: Nov 12, 1965 Referring Provider:  Justice Britain, MD  Encounter Date: 10/16/2014      PT End of Session - 10/16/14 1508    Visit Number 13   Number of Visits 24   Date for PT Re-Evaluation 10/30/14   PT Start Time 1503   PT Stop Time 3382   PT Time Calculation (min) 61 min   Activity Tolerance Patient tolerated treatment well      Past Medical History  Diagnosis Date  . CHF (congestive heart failure)   . Hyperlipidemia   . Thyroid disease     Past Surgical History  Procedure Laterality Date  . Abdominal hysterectomy      There were no vitals filed for this visit.  Visit Diagnosis:  Pain in left knee  Stiffness of knee joint, left  Swelling of limb  Generalized muscle weakness      Subjective Assessment - 10/16/14 1509    Subjective Pt reports she is having a difficult time sleeping at night due to increased pain. Overall, she feels like things are improving.    Currently in Pain? Yes   Pain Score 2    Pain Location Knee   Pain Orientation Left   Pain Descriptors / Indicators --  stinging   Aggravating Factors  night time when trying to sleep   Pain Relieving Factors ice/ medication             [facility] PT Assessment - 10/16/14 0001    Assessment   Medical Diagnosis Lt knee ACL tear; repair   Onset Date/Surgical Date 07/29/14   Next MD Visit 10/20/14   AROM   AROM Assessment Site Knee   Right/Left Knee Left;Right   Right Knee Extension 0   Right Knee Flexion 138   Left Knee Extension 0   Left Knee Flexion 130             [facility] Adult PT Treatment/Exercise - 10/16/14 0001    Knee/Hip Exercises: Stretches   Passive Hamstring Stretch Right;Left;2 reps;30 seconds  with strap   Quad Stretch Left;30  seconds;3 reps   ITB Stretch Left;30 seconds;Right;2 reps  supine with strap, cross body   Gastroc Stretch Right;Left;4 reps   Knee/Hip Exercises: Aerobic   Stationary Bike L2: 5 min    Knee/Hip Exercises: Machines for Strengthening   Cybex Knee Extension (90-40 deg); 1 plate x 10 with BLE, x 10 with LLE   Cybex Leg Press (0-60 deg) with LLE: 5 plates x 12; 6 plates x 12    Knee/Hip Exercises: Standing   Heel Raises Both;2 sets;10 reps   Lateral Step Up Left;2 sets;10 reps   Lateral Step Up Limitations Onto Bosu    Lunge Walking - Round Trips 15 ft (mini lunge) alternating feet; very challenging.    SLS SLS on mini-tramp LLE with ball toss x 30 sec x 3 trials.    Modalities   Modalities Vasopneumatic   Vasopneumatic   Number Minutes Vasopneumatic  15 minutes   Vasopnuematic Location  Knee   Vasopneumatic Pressure Medium   Vasopneumatic Temperature  3*                  PT Short Term Goals - 09/15/14 1520    PT SHORT TERM GOAL #1   Title  I with initial HEP   Status Achieved   PT SHORT TERM GOAL #2   Title able to perform a solid quad contraction and SLR on L with no lag   Status Achieved   PT SHORT TERM GOAL #3   Title Improved Left knee flexion to 100 degrees actively   Status Achieved           PT Long Term Goals - 10/16/14 1613    PT LONG TERM GOAL #1   Title I with advanced HEP   Time 8   Period Weeks   Status On-going   PT LONG TERM GOAL #2   Title L knee flexion within 10 degrees of Rt   Time 8   Period Weeks   Status Achieved   PT LONG TERM GOAL #3   Title demo 5/5 LLE strength   Time 8   Period Weeks   Status Partially Met   PT LONG TERM GOAL #4   Title climb stairs with a reciprocal gait pattern on stairs without immobilizer   Time 10   Period Weeks   Status Achieved   PT LONG TERM GOAL #5   Title amb with a normal gait pattern without AD    Time 8   Period Weeks   Status Partially Met   PT LONG TERM GOAL #6   Title decreased pain  by 50% with sitting and standing   Time 8   Period Weeks   Status Achieved   PT LONG TERM GOAL #7   Title improved FOTO to 50% limitation   Time 10   Period Weeks   Status On-going               Plan - 10/16/14 1746    Clinical Impression Statement Pt making great strides towards remaining goals. Pt demo improved Lt knee flexion to 130 deg in supine. Pt continues with decreased strength in Lt quad with functional activities; will benefit from continued PT intervtnion to maximize independence and safety.    Pt will benefit from skilled therapeutic intervention in order to improve on the following deficits Abnormal gait;Decreased range of motion;Difficulty walking;Decreased activity tolerance;Pain;Decreased strength;Decreased mobility   Rehab Potential Good   PT Frequency 2x / week   PT Duration 12 weeks   PT Treatment/Interventions ADLs/Self Care Home Management;Moist Heat;Patient/family education;DME Instruction;Therapeutic exercise;Passive range of motion;Ultrasound;Gait training;Manual techniques;Cryotherapy;Stair training;Neuromuscular re-education;Electrical Stimulation   PT Next Visit Plan progress with closed chain exercises per protocol   Consulted and Agree with Plan of Care Patient        Problem List There are no active problems to display for this patient.  Kerin Perna, PTA 10/16/2014 5:51 PM    [place], [place], [address] Phone: [phone]   Fax:  [phone]

## 2014-10-23 ENCOUNTER — Encounter: Payer: PRIVATE HEALTH INSURANCE | Admitting: Sports Medicine

## 2014-10-23 ENCOUNTER — Ambulatory Visit (INDEPENDENT_AMBULATORY_CARE_PROVIDER_SITE_OTHER): Payer: PRIVATE HEALTH INSURANCE | Admitting: Physical Therapy

## 2014-10-23 DIAGNOSIS — M7989 Other specified soft tissue disorders: Secondary | ICD-10-CM

## 2014-10-23 DIAGNOSIS — M6281 Muscle weakness (generalized): Secondary | ICD-10-CM

## 2014-10-23 DIAGNOSIS — M25562 Pain in left knee: Secondary | ICD-10-CM

## 2014-10-23 DIAGNOSIS — M25662 Stiffness of left knee, not elsewhere classified: Secondary | ICD-10-CM | POA: Diagnosis not present

## 2014-10-23 NOTE — Therapy (Addendum)
[place], [place], [address] Phone: [phone]   Fax:  [phone]  Physical Therapy Treatment  Patient Details  Name: Lindsey Love MRN: [id number] Date of Birth: 1965/03/23 Referring Provider:  Justice Britain, MD  Encounter Date: 10/23/2014      PT End of Session - 10/23/14 1531    Visit Number 14   Number of Visits 24   Date for PT Re-Evaluation 10/30/14   PT Start Time 2993   PT Stop Time 7169   PT Time Calculation (min) 59 min   Activity Tolerance Patient tolerated treatment well      Past Medical History  Diagnosis Date  . CHF (congestive heart failure)   . Hyperlipidemia   . Thyroid disease     Past Surgical History  Procedure Laterality Date  . Abdominal hysterectomy      There were no vitals filed for this visit.  Visit Diagnosis:  Pain in left knee  Stiffness of knee joint, left  Swelling of limb  Generalized muscle weakness          [facility] PT Assessment - 10/23/14 0001    Assessment   Medical Diagnosis Lt knee ACL tear; repair   Onset Date/Surgical Date 07/29/14   Next MD Visit 11/17/14   AROM   AROM Assessment Site Knee   Right/Left Knee Left   Right Knee Extension 0   Right Knee Flexion 138   Left Knee Extension 0   Left Knee Flexion 131   Strength   Right/Left Hip Left   Left Hip Extension --  5-/5   Left Hip ABduction 5/5                     [facility] Adult PT Treatment/Exercise - 10/23/14 0001    Exercises   Exercises Knee/Hip   Knee/Hip Exercises: Aerobic   Stationary Bike L3: 5 min    Knee/Hip Exercises: Machines for Strengthening   Cybex Knee Extension (90-40 deg); 1 plate x 20 with BLE, x 10 with LLE   Cybex Leg Press (0-60 deg) with LLE: 6 plates x 12; 7 plates x 12    Knee/Hip Exercises: Standing   Heel Raises Both;2 sets;10 reps  toes straight, toes out    Knee/Hip Exercises: Prone   Hamstring Curl 3 sets;10 reps   Hamstring Curl Limitations 3#   Modalities   Modalities Vasopneumatic   Vasopneumatic   Number Minutes Vasopneumatic  15 minutes   Vasopnuematic Location  Knee   Vasopneumatic Pressure Medium   Vasopneumatic Temperature  3*      Lt SLR with ER x 10 reps  SLS on blue foam with horizontal head turns x 30 sec x 2; on Bosu x 15 sec x 4 reps               PT Short Term Goals - 09/15/14 1520    PT SHORT TERM GOAL #1   Title I with initial HEP   Status Achieved   PT SHORT TERM GOAL #2   Title able to perform a solid quad contraction and SLR on L with no lag   Status Achieved   PT SHORT TERM GOAL #3   Title Improved Left knee flexion to 100 degrees actively   Status Achieved           PT Long Term Goals - 10/16/14 1613    PT LONG TERM GOAL #1   Title I with advanced HEP  Time 8   Period Weeks   Status On-going   PT LONG TERM GOAL #2   Title L knee flexion within 10 degrees of Rt   Time 8   Period Weeks   Status Achieved   PT LONG TERM GOAL #3   Title demo 5/5 LLE strength   Time 8   Period Weeks   Status Partially Met   PT LONG TERM GOAL #4   Title climb stairs with a reciprocal gait pattern on stairs without immobilizer   Time 10   Period Weeks   Status Achieved   PT LONG TERM GOAL #5   Title amb with a normal gait pattern without AD    Time 8   Period Weeks   Status Partially Met   PT LONG TERM GOAL #6   Title decreased pain by 50% with sitting and standing   Time 8   Period Weeks   Status Achieved   PT LONG TERM GOAL #7   Title improved FOTO to 50% limitation   Time 10   Period Weeks   Status On-going               Plan - 10/23/14 1534    Clinical Impression Statement Pt demo slight increase in Lt knee flexion and improvement in Lt hip abduction strength.  Making great progress towards goals. Pt interested in increasing strength in Lt knee to be able to get up and down from squatting position.    Pt will benefit from skilled  therapeutic intervention in order to improve on the following deficits Abnormal gait;Decreased range of motion;Difficulty walking;Decreased activity tolerance;Pain;Decreased strength;Decreased mobility   Rehab Potential Good   PT Frequency 2x / week   PT Duration 12 weeks   PT Treatment/Interventions ADLs/Self Care Home Management;Moist Heat;Patient/family education;DME Instruction;Therapeutic exercise;Passive range of motion;Ultrasound;Gait training;Manual techniques;Cryotherapy;Stair training;Neuromuscular re-education;Electrical Stimulation   PT Next Visit Plan progress with closed chain exercises per protocol. Pt to bring in recert from MD that requested 1 additional month of therapy for pt to meet established goals.    Consulted and Agree with Plan of Care Patient        Problem List There are no active problems to display for this patient.   Kerin Perna, PTA 10/23/2014 6:12 PM  [facility] [place], [place], [address] Phone: [phone]   Fax:  [phone]    PHYSICAL THERAPY DISCHARGE SUMMARY  Visits from Start of Care: 14  Current functional level related to goals / functional outcomes: See above   Remaining deficits: unknown   Education / Equipment: HEP Plan:                                                    Patient goals were partially met. Patient is being discharged due to not returning since the last visit.  Patient has not returned calls to schedule follow up appointments. ?????        Jeral Pinch, PT 11/12/2014 4:43 PM

## 2014-10-30 ENCOUNTER — Ambulatory Visit: Payer: PRIVATE HEALTH INSURANCE | Admitting: Family Medicine

## 2014-10-30 ENCOUNTER — Encounter: Payer: Self-pay | Admitting: Rehabilitative and Restorative Service Providers"

## 2016-03-20 IMAGING — CR DG KNEE COMPLETE 4+V*L*
4 series · 4 of 4 positions shown · non-contrast
Comparison: None.

CLINICAL DATA: Lt knee redness around incision x 1 week after
HX: Septic x 1 year ago with sinus infection.

EXAM:
LEFT KNEE - COMPLETE 4+ VIEW

[t knee ap left]
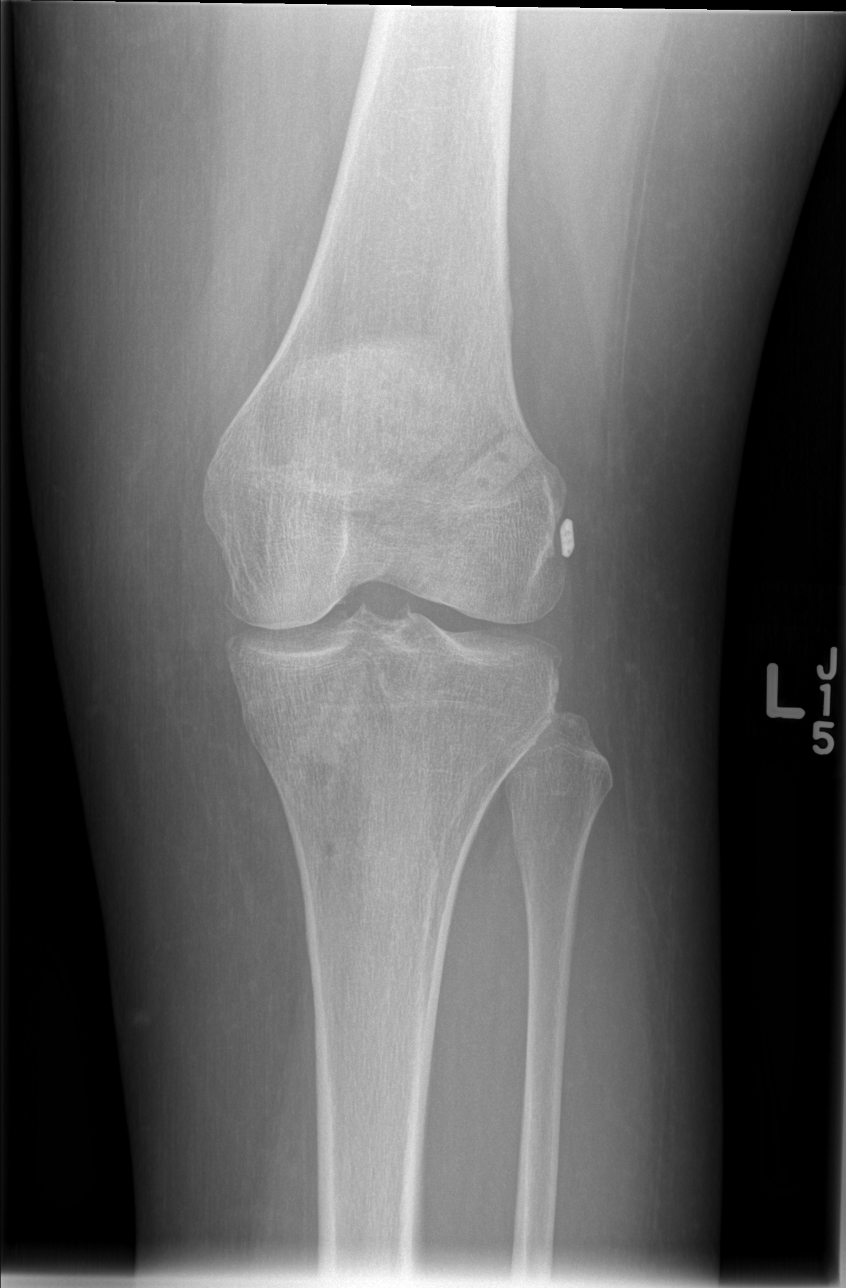

[t knee oblique left (1 of 2)]
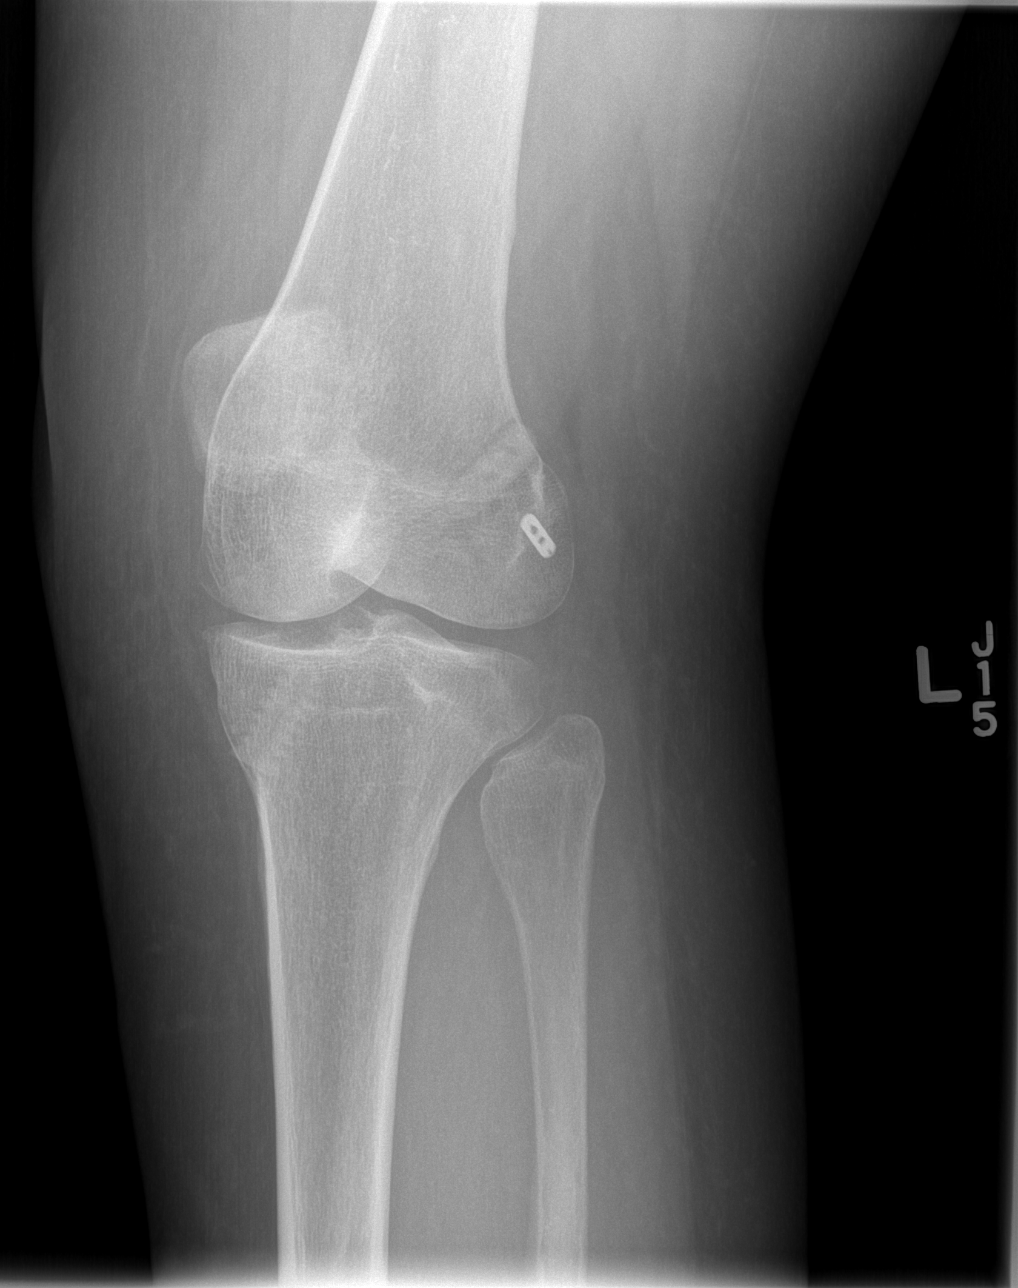

[t knee oblique left (2 of 2)]
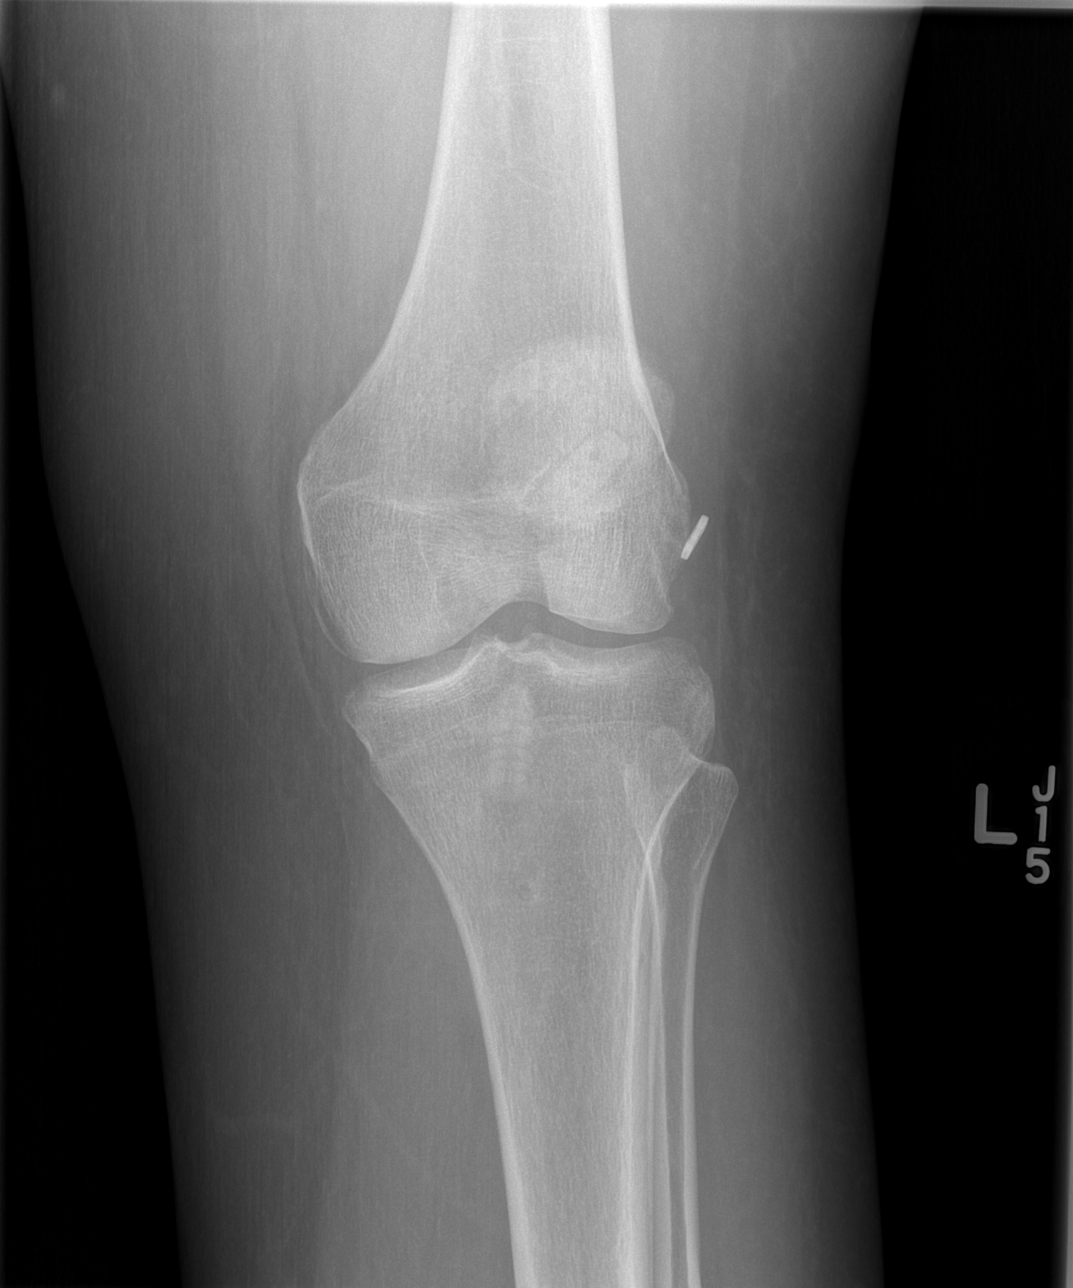

[t knee lat left]
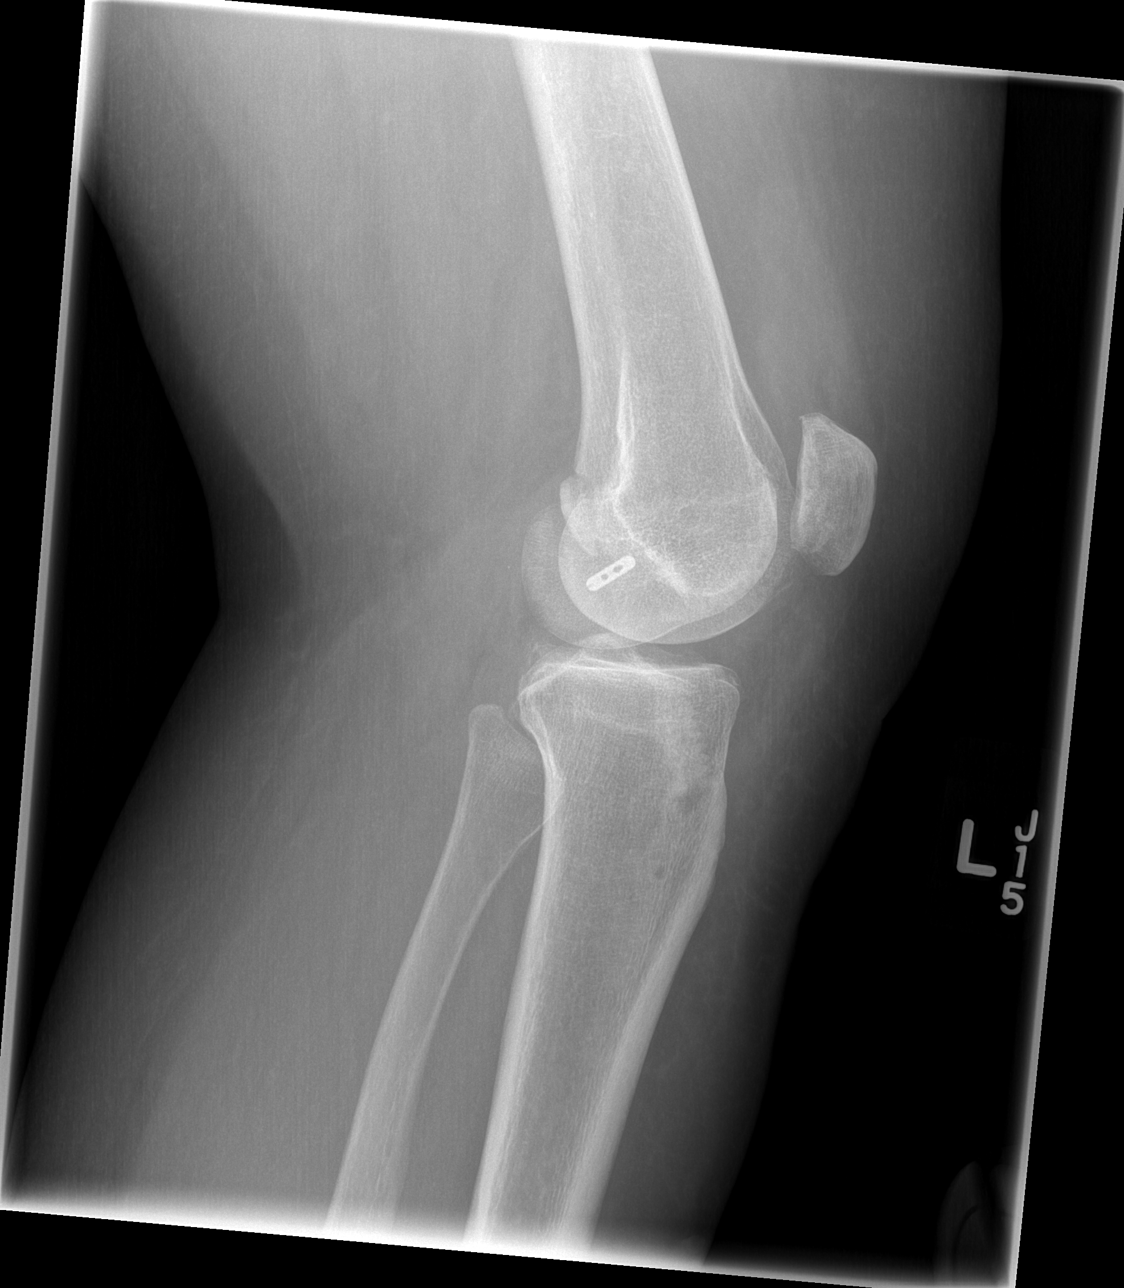

[4 of 4 positions shown; findings below may reference images not displayed]

FINDINGS: There are changes from anterior cruciate ligament reconstruction
with formation of a tibial and distal femoral tunnel. There is no
acute fracture. There is no bone resorption to suggest
osteomyelitis. Knee joint is normally spaced and aligned. Minimal
joint effusion is suggested on the lateral view. Soft tissues
otherwise unremarkable. No soft tissue air.
IMPRESSION: 1. No fracture. No radiopaque foreign body other than the orthopedic
hardware related to the ACL reconstruction.
2. No evidence of osteomyelitis.
3. Minimal joint effusion.

## 2016-09-21 ENCOUNTER — Encounter: Payer: Self-pay | Admitting: *Deleted

## 2016-09-21 ENCOUNTER — Emergency Department (INDEPENDENT_AMBULATORY_CARE_PROVIDER_SITE_OTHER)
Admission: EM | Admit: 2016-09-21 | Discharge: 2016-09-21 | Disposition: A | Discharge status: Other Acute Inpatient Hospital | Payer: 59 | Source: Home / Self Care | Attending: Family Medicine | Admitting: Family Medicine

## 2016-09-21 DIAGNOSIS — T782XXA Anaphylactic shock, unspecified, initial encounter: Secondary | ICD-10-CM

## 2016-09-21 DIAGNOSIS — T63441A Toxic effect of venom of bees, accidental (unintentional), initial encounter: Secondary | ICD-10-CM

## 2016-09-21 MED ORDER — DEXAMETHASONE SODIUM PHOSPHATE 10 MG/ML IJ SOLN
10.0000 mg | Freq: Once | INTRAMUSCULAR | Status: AC
  Administered 2016-09-21: 10 mg via INTRAMUSCULAR

## 2016-09-21 MED ORDER — IBUPROFEN 800 MG PO TABS
800.0000 mg | ORAL_TABLET | Freq: Once | ORAL | Status: AC
  Administered 2016-09-21: 800 mg via ORAL

## 2016-09-21 MED ORDER — DIPHENHYDRAMINE HCL 50 MG/ML IJ SOLN
25.0000 mg | Freq: Once | INTRAMUSCULAR | Status: AC
  Administered 2016-09-21: 25 mg via INTRAMUSCULAR

## 2016-09-21 MED ORDER — FAMOTIDINE 40 MG PO TABS
40.0000 mg | ORAL_TABLET | Freq: Once | ORAL | Status: AC
  Administered 2016-09-21: 40 mg via ORAL

## 2016-09-21 MED ORDER — EPINEPHRINE 0.3 MG/0.3ML IJ SOAJ
0.3000 mg | Freq: Once | INTRAMUSCULAR | Status: AC
  Administered 2016-09-21: 0.3 mg via INTRAMUSCULAR

## 2016-09-21 NOTE — ED Triage Notes (Signed)
Patient reports being stung by 2 yellow jackets about 15 minutes ago. H/o anaphylaxis. Patient is SOB, anxious,petechiae noted in her mouth. Epi immediately given. Provider notified.

## 2016-09-21 NOTE — ED Provider Notes (Signed)
CSN: [id number]     Arrival date & time 09/21/16  1133 History   First MD Initiated Contact with Patient 09/21/16 1135     Chief Complaint  Patient presents with  . Allergic Reaction    Anaphylaxis   (Consider location/radiation/quality/duration/timing/severity/associated sxs/prior Treatment) HPI  Lindsey Love is a 51 y.o. female presenting to UC with hx of allergy to bee venom, anaphylaxis, presenting to UC with reports of 2-3 bee stings to Right lower back around 11:15AM.  She is reporting SOB, hives, itching, and feeling anxious.  She [place] her [place]'s farm when she was stung.  Pt feels like her throat is getting tight.  Denies nausea.  She has an Epi pen but did not have it with her. She rushed over to [facility] for treatment. No medications taken PTA. Pt states she had an anaphylactic reaction to a bee sting about 6 years ago.  Pt states she was turning blue by the time she got to hospital last time.     Past Medical History:  Diagnosis Date  . CHF (congestive heart failure) (HCC)   . Hyperlipidemia   . Thyroid disease    Past Surgical History:  Procedure Laterality Date  . ABDOMINAL HYSTERECTOMY     Family History  Problem Relation Age of Onset  . Cancer Mother    Social History  Substance Use Topics  . Smoking status: Former Games developer  . Smokeless tobacco: Never Used  . Alcohol use No   OB History    No data available     Review of Systems  HENT: Positive for sore throat (irritation).   Respiratory: Positive for chest tightness and shortness of breath.   Gastrointestinal: Negative for abdominal pain, diarrhea, nausea and vomiting.  Musculoskeletal: Positive for back pain (from bee stings) and myalgias.  Skin: Positive for rash. Negative for wound.    Allergies  Bee venom; Other; and Penicillins  Home Medications   Prior to Admission medications   Medication Sig Start Date End Date Taking? Authorizing Provider  clonazePAM (KLONOPIN) 1 MG tablet Take 1 mg by mouth 2  (two) times daily.    [provider]  cyclobenzaprine (FLEXERIL) 10 MG tablet Take 10 mg by mouth 4 (four) times daily as needed for muscle spasms.    [provider]  diazepam (VALIUM) 1 MG/ML solution Take by mouth every 8 (eight) hours as needed for anxiety.    [provider]  ESTROGENS CONJUGATED PO Take by mouth daily.    [provider]  gabapentin (NEURONTIN) 100 MG capsule Take 100 mg by mouth 3 (three) times daily.    [provider]  levothyroxine (SYNTHROID, LEVOTHROID) 50 MCG tablet Take 50 mcg by mouth daily before breakfast.    [provider]  ROPINIROLE HCL PO Take by mouth daily.    [provider]  sertraline (ZOLOFT) 100 MG tablet Take 100 mg by mouth 2 (two) times daily.    [provider]  traZODone (DESYREL) 100 MG tablet Take 100 mg by mouth at bedtime.    [provider]   Meds Ordered and Administered this Visit   Medications  EPINEPHrine (EPI-PEN) injection 0.3 mg (0.3 mg Intramuscular Given 09/21/16 1135)  dexamethasone (DECADRON) injection 10 mg (10 mg Intramuscular Given 09/21/16 1142)  famotidine (PEPCID) tablet 40 mg (40 mg Oral Given 09/21/16 1145)  ibuprofen (ADVIL,MOTRIN) tablet 800 mg (800 mg Oral Given 09/21/16 1152)  diphenhydrAMINE (BENADRYL) injection 25 mg (25 mg Intramuscular Given 09/21/16 1142)  BP 112/80 (BP Location: Right Arm)   Pulse 84   SpO2 97%  No data found.   Physical Exam  Constitutional: She is oriented to person, place, and time. She appears well-developed and well-nourished. No distress.  HENT:  Head: Normocephalic and atraumatic.  Mouth/Throat: Oropharynx is clear and moist.  Eyes: EOM are normal.  Neck: Normal range of motion. Neck supple.  Cardiovascular: Normal rate and regular rhythm.   Pulmonary/Chest: Effort normal and breath sounds normal. No respiratory distress. She has no wheezes. She has no rales.  Musculoskeletal: Normal range of  motion.  Neurological: She is alert and oriented to person, place, and time.  Skin: Skin is warm and dry. Capillary refill takes less than 2 seconds. Rash noted. She is not diaphoretic. There is erythema.  Left arm faint erythema, wheals c/w urticaria  Right lower back 3 small puncture wounds with mild edema and surrounding erythema c/w stings.  Psychiatric: Her behavior is normal. Her mood appears anxious ( mildly).  Nursing note and vitals reviewed.   Urgent Care Course     Procedures (including critical care time)  Labs Review Labs Reviewed - No data to display  Imaging Review No results found.    MDM   1. Anaphylaxis, initial encounter   2. Bee sting reaction, accidental or unintentional, initial encounter    Pt presenting to UC having an anaphylactic reaction to bee or yellow jacket stings about 15 minutes PTA.  Adult EpiPen, Decadron 10mg  IM, Benadryl 25mg  PO, Pepcid 40mg  PO  Pt in stable condition. A[facility]able [facility]  Pt discharged in stable condition.    Lurene Shadowhelps, Nezzie Manera O, [place] 09/21/16 1216

## 2016-09-22 ENCOUNTER — Telehealth: Payer: Self-pay | Admitting: Emergency Medicine

## 2016-09-22 NOTE — Telephone Encounter (Signed)
Patient just got home from follow up from PCP, she is feeling fine, tired, everything checked out ok.

## 2017-05-08 ENCOUNTER — Emergency Department (HOSPITAL_BASED_OUTPATIENT_CLINIC_OR_DEPARTMENT_OTHER): Payer: Worker's Compensation

## 2017-05-08 ENCOUNTER — Other Ambulatory Visit: Payer: Self-pay

## 2017-05-08 ENCOUNTER — Emergency Department (HOSPITAL_BASED_OUTPATIENT_CLINIC_OR_DEPARTMENT_OTHER)
Admission: EM | Admit: 2017-05-08 | Discharge: 2017-05-08 | Disposition: A | Discharge status: Home / Self Care | Payer: Worker's Compensation | Attending: Emergency Medicine | Admitting: Emergency Medicine

## 2017-05-08 ENCOUNTER — Encounter (HOSPITAL_BASED_OUTPATIENT_CLINIC_OR_DEPARTMENT_OTHER): Payer: Self-pay | Admitting: Emergency Medicine

## 2017-05-08 DIAGNOSIS — Z79899 Other long term (current) drug therapy: Secondary | ICD-10-CM | POA: Diagnosis not present

## 2017-05-08 DIAGNOSIS — S0003XA Contusion of scalp, initial encounter: Secondary | ICD-10-CM | POA: Insufficient documentation

## 2017-05-08 DIAGNOSIS — W01198A Fall on same level from slipping, tripping and stumbling with subsequent striking against other object, initial encounter: Secondary | ICD-10-CM | POA: Insufficient documentation

## 2017-05-08 DIAGNOSIS — Z23 Encounter for immunization: Secondary | ICD-10-CM | POA: Diagnosis not present

## 2017-05-08 DIAGNOSIS — S0990XA Unspecified injury of head, initial encounter: Secondary | ICD-10-CM | POA: Diagnosis present

## 2017-05-08 DIAGNOSIS — Y9289 Other specified places as the place of occurrence of the external cause: Secondary | ICD-10-CM | POA: Diagnosis not present

## 2017-05-08 DIAGNOSIS — I509 Heart failure, unspecified: Secondary | ICD-10-CM | POA: Insufficient documentation

## 2017-05-08 DIAGNOSIS — Y939 Activity, unspecified: Secondary | ICD-10-CM | POA: Diagnosis not present

## 2017-05-08 DIAGNOSIS — F17210 Nicotine dependence, cigarettes, uncomplicated: Secondary | ICD-10-CM | POA: Insufficient documentation

## 2017-05-08 DIAGNOSIS — Y990 Civilian activity done for income or pay: Secondary | ICD-10-CM | POA: Insufficient documentation

## 2017-05-08 DIAGNOSIS — W19XXXA Unspecified fall, initial encounter: Secondary | ICD-10-CM

## 2017-05-08 MED ORDER — HYDROCODONE-ACETAMINOPHEN 5-325 MG PO TABS
1.0000 | ORAL_TABLET | Freq: Once | ORAL | Status: AC
  Administered 2017-05-08: 1 via ORAL
  Filled 2017-05-08: qty 1

## 2017-05-08 MED ORDER — ACETAMINOPHEN 325 MG PO TABS
650.0000 mg | ORAL_TABLET | Freq: Once | ORAL | Status: AC
  Administered 2017-05-08: 650 mg via ORAL
  Filled 2017-05-08: qty 2

## 2017-05-08 MED ORDER — TETANUS-DIPHTH-ACELL PERTUSSIS 5-2.5-18.5 LF-MCG/0.5 IM SUSP
0.5000 mL | Freq: Once | INTRAMUSCULAR | Status: AC
  Administered 2017-05-08: 0.5 mL via INTRAMUSCULAR
  Filled 2017-05-08: qty 0.5

## 2017-05-08 NOTE — Discharge Instructions (Signed)
Ibuprofen or tylenol for pain. Refer to the attached instructions. Follow-up with your primary care provider.

## 2017-05-08 NOTE — ED Notes (Signed)
UDS completed 

## 2017-05-08 NOTE — ED Triage Notes (Signed)
Per EMS patient fell at work.  States that she fell over a hanger and then hit 3-4 shelves with her head.  Denies LOC.  Reports pain to head, right foot and left shoulder.

## 2017-05-08 NOTE — ED Notes (Signed)
Patient transported to X-ray & CT °

## 2017-05-08 NOTE — ED Provider Notes (Signed)
[facility] EMERGENCY DEPARTMENT Provider Note   CSN: [id number] Arrival date & time: 05/08/17  1344     History   Chief Complaint Chief Complaint  Patient presents with  . Fall    HPI Lindsey Love is a 52 y.o. female.  Patient states she fell after stumbling over an object on the floor at work, landing on a stack of shelves. She states she struck the left side of her head/face, left shoulder, left hip, and left knee. No loss of consciousness. Persistent headache with dizziness. Superficial skin wound left knee and left upper arm. She reports working around [employer] with frequent minor skin intrusions. Tetanus out of date.   The history is provided by the patient. No language interpreter was used.  Fall  This is a new problem. The current episode started 3 to 5 hours ago. Associated symptoms include headaches.    Past Medical History:  Diagnosis Date  . CHF (congestive heart failure) (HCC)   . Hyperlipidemia   . Thyroid disease     There are no active problems to display for this patient.   Past Surgical History:  Procedure Laterality Date  . ABDOMINAL HYSTERECTOMY      OB History    No data available       Home Medications    Prior to Admission medications   Medication Sig Start Date End Date Taking? Authorizing Provider  clonazePAM (KLONOPIN) 1 MG tablet Take 1 mg by mouth 2 (two) times daily.    [provider]  cyclobenzaprine (FLEXERIL) 10 MG tablet Take 10 mg by mouth 4 (four) times daily as needed for muscle spasms.    [provider]  diazepam (VALIUM) 1 MG/ML solution Take by mouth every 8 (eight) hours as needed for anxiety.    [provider]  ESTROGENS CONJUGATED PO Take by mouth daily.    [provider]  gabapentin (NEURONTIN) 100 MG capsule Take 100 mg by mouth 3 (three) times daily.    [provider]  levothyroxine (SYNTHROID, LEVOTHROID) 50 MCG tablet Take 50 mcg by mouth  daily before breakfast.    [provider]  ROPINIROLE HCL PO Take by mouth daily.    [provider]  sertraline (ZOLOFT) 100 MG tablet Take 100 mg by mouth 2 (two) times daily.    [provider]  traZODone (DESYREL) 100 MG tablet Take 100 mg by mouth at bedtime.    [provider]    Family History Family History  Problem Relation Age of Onset  . Cancer Mother     Social History Social History   Tobacco Use  . Smoking status: Current Every Day Smoker    Packs/day: 0.50    Types: Cigarettes  . Smokeless tobacco: Never Used  Substance Use Topics  . Alcohol use: No  . Drug use: No     Allergies   Bee venom; Other; and Penicillins   Review of Systems Review of Systems  Musculoskeletal: Positive for arthralgias and myalgias. Negative for neck pain.  Neurological: Positive for dizziness and headaches.  All other systems reviewed and are negative.    Physical Exam Updated Vital Signs BP 117/68   Pulse 70   Temp 97.9 F (36.6 C)   Resp 16   Ht 5\' 4"  (1.626 m)   Wt 81.6 kg (180 lb)   SpO2 95%   BMI 30.90 kg/m   Physical Exam  Constitutional: She is oriented to person, place, and time.  She appears well-developed and well-nourished.  HENT:  Head: Normocephalic.  Eyes: Conjunctivae and EOM are normal. Right pupil is not reactive. Left pupil is not reactive.  Pupils dilated (5mm) and non-responsive to light. Patient endorses use of adderal on days she works.  Neck: Normal range of motion. Neck supple.  Cardiovascular: Normal rate and regular rhythm.  Pulmonary/Chest: Effort normal and breath sounds normal.  Abdominal: Soft. Bowel sounds are normal.  Musculoskeletal: Normal range of motion. She exhibits tenderness.  Neurological: She is alert and oriented to person, place, and time. No cranial nerve deficit or sensory deficit.  Skin: Skin is warm and dry.  Psychiatric: She has a normal mood and affect. Her speech is normal and  behavior is normal. Judgment and thought content normal. Cognition and memory are normal.  Nursing note and vitals reviewed.    ED Treatments / Results  Labs (all labs ordered are listed, but only abnormal results are displayed) Labs Reviewed - No data to display  EKG  EKG Interpretation None       Radiology Ct Head Wo Contrast  Result Date: 05/08/2017 CLINICAL DATA:  Fall today at work. Head injury. Initial encounter. EXAM: CT HEAD WITHOUT CONTRAST CT CERVICAL SPINE WITHOUT CONTRAST TECHNIQUE: Multidetector CT imaging of the head and cervical spine was performed following the standard protocol without intravenous contrast. Multiplanar CT image reconstructions of the cervical spine were also generated. COMPARISON:  None. FINDINGS: CT HEAD FINDINGS Brain: No evidence of acute infarction, hemorrhage, hydrocephalus, extra-axial collection or mass lesion/mass effect. Vascular: No hyperdense vessel or unexpected calcification. Skull: Mild left frontal scalp swelling.  Negative for fracture. Sinuses/Orbits: Negative CT CERVICAL SPINE FINDINGS Alignment: Normal. Skull base and vertebrae: No acute fracture. No primary bone lesion or focal pathologic process. Soft tissues and spinal canal: No prevertebral fluid or swelling. No visible canal hematoma. Disc levels: Mild upper cervical facet spurring. No evidence of cord impingement Upper chest: No evidence of injury IMPRESSION: 1. No evidence of acute intracranial or cervical spine injury. 2. Left forehead contusion without fracture. Electronically Signed   By: Marnee Spring M.D.   On: 05/08/2017 16:38   Ct Cervical Spine Wo Contrast  Result Date: 05/08/2017 CLINICAL DATA:  Fall today at work. Head injury. Initial encounter. EXAM: CT HEAD WITHOUT CONTRAST CT CERVICAL SPINE WITHOUT CONTRAST TECHNIQUE: Multidetector CT imaging of the head and cervical spine was performed following the standard protocol without intravenous contrast. Multiplanar CT image  reconstructions of the cervical spine were also generated. COMPARISON:  None. FINDINGS: CT HEAD FINDINGS Brain: No evidence of acute infarction, hemorrhage, hydrocephalus, extra-axial collection or mass lesion/mass effect. Vascular: No hyperdense vessel or unexpected calcification. Skull: Mild left frontal scalp swelling.  Negative for fracture. Sinuses/Orbits: Negative CT CERVICAL SPINE FINDINGS Alignment: Normal. Skull base and vertebrae: No acute fracture. No primary bone lesion or focal pathologic process. Soft tissues and spinal canal: No prevertebral fluid or swelling. No visible canal hematoma. Disc levels: Mild upper cervical facet spurring. No evidence of cord impingement Upper chest: No evidence of injury IMPRESSION: 1. No evidence of acute intracranial or cervical spine injury. 2. Left forehead contusion without fracture. Electronically Signed   By: Marnee Spring M.D.   On: 05/08/2017 16:38   Dg Foot Complete Right  Result Date: 05/08/2017 CLINICAL DATA:  Larey Seat with pain in the right foot laterally with some bruising EXAM: RIGHT FOOT COMPLETE - 3+ VIEW COMPARISON:  None. FINDINGS: The only questionable small avulsion fragment is near the base of the  middle phalanx of the right fifth toe. This could represent and old injury as well. There is some degenerative change of the right first MTP joint with loss of joint space and sclerosis. No definite acute fracture is seen. Tarsal-metatarsal alignment is normal. A plantar calcaneal degenerative spur is present. IMPRESSION: 1. Cannot exclude small avulsion fragment from the base of the middle phalanx of the right fifth toe. Correlate clinically. This could be old. 2. 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 Electronically Signed   By: Dwyane DeePaul  Barry M.D.   On: 05/08/2017 16:38    Procedures Procedures (including critical care time)  Medications Ordered in ED Medications  acetaminophen (TYLENOL) tablet 650 mg (650 mg Oral Given 05/08/17  1411)     Initial Impression / Assessment and Plan / ED Course  I have reviewed the triage vital signs and the nursing notes.  Pertinent labs & imaging results that were available during my care of the patient were reviewed by me and considered in my medical decision making (see chart for details).     Patient presents to ED for evaluation after a fall at work. She hit her head, no loss of consciousness.  Complaining of headache, neck pain, and right foot pain. No vomiting. No focal neurological deficits on physical exam. Radiology results reviewed: No evidence of acute intracranial or cervical spine injury. Left forehead contusion. No acute findings of right foot. Care instructions and return precautions discussed. Patient to follow-up with her PCP. Pt appears safe for discharge at this time.  Final Clinical Impressions(s) / ED Diagnoses   Final diagnoses:  Fall, initial encounter  Contusion of scalp, initial encounter    ED Discharge Orders    None       Felicie MornSmith, Darius Lundberg, NP 05/09/17 Marilu [id number]    Pfeiffer, Marcy, MD 05/12/17 [phone]

## 2019-01-04 ENCOUNTER — Emergency Department
Admission: EM | Admit: 2019-01-04 | Discharge: 2019-01-04 | Discharge status: Absent without leave | Payer: Self-pay | Source: Home / Self Care
